# Patient Record
Sex: Male | Born: 1955 | Race: White | Hispanic: No | Marital: Married | Smoking: Former smoker
Health system: Southern US, Community
[De-identification: ages and names within clinical notes are randomized; demographics above are authoritative.]

## PROBLEM LIST (undated history)

## (undated) DIAGNOSIS — H52203 Unspecified astigmatism, bilateral: Secondary | ICD-10-CM

## (undated) DIAGNOSIS — E785 Hyperlipidemia, unspecified: Secondary | ICD-10-CM

## (undated) DIAGNOSIS — H269 Unspecified cataract: Secondary | ICD-10-CM

## (undated) DIAGNOSIS — T7840XA Allergy, unspecified, initial encounter: Secondary | ICD-10-CM

## (undated) HISTORY — DX: Allergy, unspecified, initial encounter: T78.40XA

## (undated) HISTORY — DX: Hyperlipidemia, unspecified: E78.5

## (undated) HISTORY — PX: WISDOM TOOTH EXTRACTION: SHX21

---
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
## 2000-11-13 ENCOUNTER — Encounter (INDEPENDENT_AMBULATORY_CARE_PROVIDER_SITE_OTHER): Payer: Self-pay | Admitting: Specialist

## 2000-11-13 ENCOUNTER — Ambulatory Visit (HOSPITAL_BASED_OUTPATIENT_CLINIC_OR_DEPARTMENT_OTHER): Admission: RE | Admit: 2000-11-13 | Discharge: 2000-11-13 | Payer: Self-pay | Admitting: General Surgery

## 2004-06-09 ENCOUNTER — Ambulatory Visit: Payer: Self-pay | Admitting: Internal Medicine

## 2004-06-16 ENCOUNTER — Ambulatory Visit: Payer: Self-pay | Admitting: Internal Medicine

## 2004-09-21 ENCOUNTER — Ambulatory Visit: Payer: Self-pay | Admitting: Internal Medicine

## 2004-09-29 ENCOUNTER — Ambulatory Visit: Payer: Self-pay | Admitting: Internal Medicine

## 2005-03-08 ENCOUNTER — Ambulatory Visit: Payer: Self-pay | Admitting: Internal Medicine

## 2005-03-22 ENCOUNTER — Ambulatory Visit: Payer: Self-pay | Admitting: Internal Medicine

## 2005-04-04 ENCOUNTER — Ambulatory Visit: Payer: Self-pay | Admitting: Internal Medicine

## 2005-06-28 ENCOUNTER — Ambulatory Visit: Payer: Self-pay | Admitting: Internal Medicine

## 2005-07-01 ENCOUNTER — Ambulatory Visit: Payer: Self-pay | Admitting: Family Medicine

## 2005-07-26 ENCOUNTER — Ambulatory Visit: Payer: Self-pay | Admitting: Internal Medicine

## 2005-08-17 ENCOUNTER — Ambulatory Visit: Payer: Self-pay | Admitting: Internal Medicine

## 2006-08-22 ENCOUNTER — Ambulatory Visit: Payer: Self-pay | Admitting: Internal Medicine

## 2006-08-22 LAB — CONVERTED CEMR LAB
ALT: 45 units/L — ABNORMAL HIGH (ref 0–40)
AST: 29 units/L (ref 0–37)
Albumin: 4.1 g/dL (ref 3.5–5.2)
Alkaline Phosphatase: 72 units/L (ref 39–117)
BUN: 10 mg/dL (ref 6–23)
Basophils Absolute: 0.1 10*3/uL (ref 0.0–0.1)
Basophils Relative: 1.1 % — ABNORMAL HIGH (ref 0.0–1.0)
Bilirubin, Direct: 0.1 mg/dL (ref 0.0–0.3)
CO2: 27 meq/L (ref 19–32)
Calcium: 9.3 mg/dL (ref 8.4–10.5)
Chloride: 107 meq/L (ref 96–112)
Cholesterol: 170 mg/dL (ref 0–200)
Creatinine, Ser: 0.9 mg/dL (ref 0.4–1.5)
Eosinophils Absolute: 0.2 10*3/uL (ref 0.0–0.6)
Eosinophils Relative: 2.6 % (ref 0.0–5.0)
GFR calc Af Amer: 115 mL/min
GFR calc non Af Amer: 95 mL/min
Glucose, Bld: 107 mg/dL — ABNORMAL HIGH (ref 70–99)
HCT: 44.1 % (ref 39.0–52.0)
HDL: 44.9 mg/dL (ref >39.0)
Hemoglobin: 15.3 g/dL (ref 13.0–17.0)
LDL Cholesterol: 90 mg/dL (ref 0–99)
Lymphocytes Relative: 26.7 % (ref 12.0–46.0)
MCHC: 34.8 g/dL (ref 30.0–36.0)
MCV: 85.3 fL (ref 78.0–100.0)
Monocytes Absolute: 0.6 10*3/uL (ref 0.2–0.7)
Monocytes Relative: 10.1 % (ref 3.0–11.0)
Neutro Abs: 3.8 10*3/uL (ref 1.4–7.7)
Neutrophils Relative %: 59.5 % (ref 43.0–77.0)
PSA: 0.45 ng/mL (ref 0.10–4.00)
Platelets: 209 10*3/uL (ref 150–400)
Potassium: 4.1 meq/L (ref 3.5–5.1)
RBC: 5.17 M/uL (ref 4.22–5.81)
RDW: 12.7 % (ref 11.5–14.6)
Sodium: 139 meq/L (ref 135–145)
TSH: 1.64 microintl units/mL (ref 0.35–5.50)
Total Bilirubin: 0.5 mg/dL (ref 0.3–1.2)
Total CHOL/HDL Ratio: 3.8
Total Protein: 7.4 g/dL (ref 6.0–8.3)
Triglycerides: 178 mg/dL — ABNORMAL HIGH (ref 0–149)
VLDL: 36 mg/dL (ref 0–40)
WBC: 6.4 10*3/uL (ref 4.5–10.5)

## 2006-08-29 ENCOUNTER — Ambulatory Visit: Payer: Self-pay | Admitting: Internal Medicine

## 2007-07-17 ENCOUNTER — Telehealth: Payer: Self-pay | Admitting: Internal Medicine

## 2007-08-29 ENCOUNTER — Ambulatory Visit: Payer: Self-pay | Admitting: Internal Medicine

## 2007-08-29 LAB — CONVERTED CEMR LAB
Bilirubin Urine: NEGATIVE
Blood in Urine, dipstick: NEGATIVE
Ketones, urine, test strip: NEGATIVE
Nitrite: NEGATIVE
Specific Gravity, Urine: >=1.03
Urobilinogen, UA: NEGATIVE
WBC Urine, dipstick: NEGATIVE
pH: 5.5

## 2007-08-30 LAB — CONVERTED CEMR LAB
ALT: 73 units/L — ABNORMAL HIGH (ref 0–53)
ALT: 73 units/L — ABNORMAL HIGH (ref 0–53)
AST: 39 units/L — ABNORMAL HIGH (ref 0–37)
AST: 39 units/L — ABNORMAL HIGH (ref 0–37)
Albumin: 4 g/dL (ref 3.5–5.2)
Albumin: 4 g/dL (ref 3.5–5.2)
Alkaline Phosphatase: 97 units/L (ref 39–117)
Alkaline Phosphatase: 97 units/L (ref 39–117)
BUN: 7 mg/dL (ref 6–23)
BUN: 7 mg/dL (ref 6–23)
Basophils Absolute: 0 10*3/uL (ref 0.0–0.1)
Basophils Absolute: 0 10*3/uL (ref 0.0–0.1)
Basophils Relative: 0.4 % (ref 0.0–1.0)
Basophils Relative: 0.4 % (ref 0.0–1.0)
Bilirubin, Direct: 0.2 mg/dL (ref 0.0–0.3)
Bilirubin, Direct: 0.2 mg/dL (ref 0.0–0.3)
CO2: 28 meq/L (ref 19–32)
CO2: 28 meq/L (ref 19–32)
Calcium: 9.4 mg/dL (ref 8.4–10.5)
Calcium: 9.4 mg/dL (ref 8.4–10.5)
Chloride: 101 meq/L (ref 96–112)
Chloride: 101 meq/L (ref 96–112)
Cholesterol: 181 mg/dL (ref 0–200)
Cholesterol: 181 mg/dL (ref 0–200)
Creatinine, Ser: 0.9 mg/dL (ref 0.4–1.5)
Creatinine, Ser: 0.9 mg/dL (ref 0.4–1.5)
Direct LDL: 100.3 mg/dL
Direct LDL: 100.3 mg/dL
Eosinophils Absolute: 0.1 10*3/uL (ref 0.0–0.6)
Eosinophils Absolute: 0.1 10*3/uL (ref 0.0–0.6)
Eosinophils Relative: 1.6 % (ref 0.0–5.0)
Eosinophils Relative: 1.6 % (ref 0.0–5.0)
GFR calc Af Amer: 115 mL/min
GFR calc Af Amer: 115 mL/min
GFR calc non Af Amer: 95 mL/min
GFR calc non Af Amer: 95 mL/min
Glucose, Bld: 198 mg/dL — ABNORMAL HIGH (ref 70–99)
Glucose, Bld: 198 mg/dL — ABNORMAL HIGH (ref 70–99)
HCT: 46 % (ref 39.0–52.0)
HCT: 46 % (ref 39.0–52.0)
HDL: 39.6 mg/dL (ref >39.0)
HDL: 39.6 mg/dL (ref >39.0)
Hemoglobin: 15.2 g/dL (ref 13.0–17.0)
Hemoglobin: 15.2 g/dL (ref 13.0–17.0)
Hgb A1c MFr Bld: 9.8 % — ABNORMAL HIGH (ref 4.6–6.0)
Lymphocytes Relative: 31.2 % (ref 12.0–46.0)
Lymphocytes Relative: 31.2 % (ref 12.0–46.0)
MCHC: 33 g/dL (ref 30.0–36.0)
MCHC: 33 g/dL (ref 30.0–36.0)
MCV: 87.5 fL (ref 78.0–100.0)
MCV: 87.5 fL (ref 78.0–100.0)
Monocytes Absolute: 0.5 10*3/uL (ref 0.2–0.7)
Monocytes Absolute: 0.5 10*3/uL (ref 0.2–0.7)
Monocytes Relative: 8.9 % (ref 3.0–11.0)
Monocytes Relative: 8.9 % (ref 3.0–11.0)
Neutro Abs: 3 10*3/uL (ref 1.4–7.7)
Neutro Abs: 3 10*3/uL (ref 1.4–7.7)
Neutrophils Relative %: 57.9 % (ref 43.0–77.0)
Neutrophils Relative %: 57.9 % (ref 43.0–77.0)
PSA: 0.46 ng/mL (ref 0.10–4.00)
PSA: 0.46 ng/mL (ref 0.10–4.00)
Platelets: 190 10*3/uL (ref 150–400)
Platelets: 190 10*3/uL (ref 150–400)
Potassium: 4.6 meq/L (ref 3.5–5.1)
Potassium: 4.6 meq/L (ref 3.5–5.1)
RBC: 5.26 M/uL (ref 4.22–5.81)
RBC: 5.26 M/uL (ref 4.22–5.81)
RDW: 12.3 % (ref 11.5–14.6)
RDW: 12.3 % (ref 11.5–14.6)
Sodium: 138 meq/L (ref 135–145)
Sodium: 138 meq/L (ref 135–145)
TSH: 1.16 microintl units/mL (ref 0.35–5.50)
TSH: 1.16 microintl units/mL (ref 0.35–5.50)
Total Bilirubin: 0.9 mg/dL (ref 0.3–1.2)
Total Bilirubin: 0.9 mg/dL (ref 0.3–1.2)
Total CHOL/HDL Ratio: 4.6
Total CHOL/HDL Ratio: 4.6
Total Protein: 6.9 g/dL (ref 6.0–8.3)
Total Protein: 6.9 g/dL (ref 6.0–8.3)
Triglycerides: 214 mg/dL (ref 0–149)
Triglycerides: 214 mg/dL (ref 0–149)
VLDL: 43 mg/dL — ABNORMAL HIGH (ref 0–40)
VLDL: 43 mg/dL — ABNORMAL HIGH (ref 0–40)
WBC: 5.3 10*3/uL (ref 4.5–10.5)
WBC: 5.3 10*3/uL (ref 4.5–10.5)

## 2007-09-04 ENCOUNTER — Ambulatory Visit: Payer: Self-pay | Admitting: Internal Medicine

## 2007-09-04 DIAGNOSIS — J309 Allergic rhinitis, unspecified: Secondary | ICD-10-CM

## 2007-09-04 DIAGNOSIS — E1169 Type 2 diabetes mellitus with other specified complication: Secondary | ICD-10-CM

## 2007-09-04 DIAGNOSIS — E785 Hyperlipidemia, unspecified: Secondary | ICD-10-CM

## 2007-09-04 DIAGNOSIS — E119 Type 2 diabetes mellitus without complications: Secondary | ICD-10-CM | POA: Insufficient documentation

## 2007-10-08 ENCOUNTER — Encounter: Admission: RE | Admit: 2007-10-08 | Discharge: 2007-10-08 | Payer: Self-pay | Admitting: Internal Medicine

## 2007-10-08 ENCOUNTER — Encounter: Payer: Self-pay | Admitting: Internal Medicine

## 2007-10-12 ENCOUNTER — Ambulatory Visit: Payer: Self-pay | Admitting: Internal Medicine

## 2007-10-26 ENCOUNTER — Ambulatory Visit: Payer: Self-pay | Admitting: Internal Medicine

## 2007-10-26 ENCOUNTER — Encounter: Payer: Self-pay | Admitting: Internal Medicine

## 2007-10-26 HISTORY — PX: COLONOSCOPY: SHX174

## 2007-12-26 ENCOUNTER — Ambulatory Visit: Payer: Self-pay | Admitting: Internal Medicine

## 2007-12-26 LAB — CONVERTED CEMR LAB
ALT: 63 units/L — ABNORMAL HIGH (ref 0–53)
AST: 34 units/L (ref 0–37)
Albumin: 3.9 g/dL (ref 3.5–5.2)
Alkaline Phosphatase: 79 units/L (ref 39–117)
BUN: 8 mg/dL (ref 6–23)
Bilirubin, Direct: 0.1 mg/dL (ref 0.0–0.3)
CO2: 29 meq/L (ref 19–32)
Calcium: 8.6 mg/dL (ref 8.4–10.5)
Chloride: 113 meq/L — ABNORMAL HIGH (ref 96–112)
Cholesterol: 168 mg/dL (ref 0–200)
Creatinine, Ser: 0.9 mg/dL (ref 0.4–1.5)
GFR calc Af Amer: 115 mL/min
GFR calc non Af Amer: 95 mL/min
Glucose, Bld: 136 mg/dL — ABNORMAL HIGH (ref 70–99)
HDL: 40.4 mg/dL (ref >39.0)
Hgb A1c MFr Bld: 7.8 % — ABNORMAL HIGH (ref 4.6–6.0)
LDL Cholesterol: 95 mg/dL (ref 0–99)
Potassium: 4.3 meq/L (ref 3.5–5.1)
Sodium: 135 meq/L (ref 135–145)
Total Bilirubin: 0.9 mg/dL (ref 0.3–1.2)
Total CHOL/HDL Ratio: 4.2
Total Protein: 7.4 g/dL (ref 6.0–8.3)
Triglycerides: 164 mg/dL — ABNORMAL HIGH (ref 0–149)
VLDL: 33 mg/dL (ref 0–40)

## 2008-01-02 ENCOUNTER — Ambulatory Visit: Payer: Self-pay | Admitting: Internal Medicine

## 2008-04-29 ENCOUNTER — Ambulatory Visit: Payer: Self-pay | Admitting: Internal Medicine

## 2008-04-29 LAB — CONVERTED CEMR LAB
ALT: 63 units/L — ABNORMAL HIGH (ref 0–53)
AST: 37 units/L (ref 0–37)
Alkaline Phosphatase: 65 units/L (ref 39–117)
Bilirubin, Direct: 0.1 mg/dL (ref 0.0–0.3)
CO2: 29 meq/L (ref 19–32)
Chloride: 106 meq/L (ref 96–112)
Glucose, Bld: 135 mg/dL — ABNORMAL HIGH (ref 70–99)
HDL: 41.1 mg/dL (ref >39.0)
Microalb, Ur: 0.5 mg/dL (ref 0.0–1.9)
Potassium: 4.2 meq/L (ref 3.5–5.1)
Sodium: 142 meq/L (ref 135–145)
Total Bilirubin: 0.8 mg/dL (ref 0.3–1.2)
Total CHOL/HDL Ratio: 4.2
Total Protein: 7.3 g/dL (ref 6.0–8.3)

## 2008-05-21 ENCOUNTER — Ambulatory Visit: Payer: Self-pay | Admitting: Internal Medicine

## 2008-08-04 ENCOUNTER — Ambulatory Visit: Payer: Self-pay | Admitting: Internal Medicine

## 2008-08-18 ENCOUNTER — Encounter: Payer: Self-pay | Admitting: Internal Medicine

## 2008-08-26 ENCOUNTER — Telehealth: Payer: Self-pay | Admitting: Internal Medicine

## 2008-08-27 ENCOUNTER — Encounter: Payer: Self-pay | Admitting: Internal Medicine

## 2008-09-17 ENCOUNTER — Ambulatory Visit: Payer: Self-pay | Admitting: Internal Medicine

## 2008-09-17 LAB — CONVERTED CEMR LAB
ALT: 63 units/L — ABNORMAL HIGH (ref 0–53)
AST: 37 units/L (ref 0–37)
Albumin: 3.9 g/dL (ref 3.5–5.2)
BUN: 8 mg/dL (ref 6–23)
Calcium: 9.2 mg/dL (ref 8.4–10.5)
Chloride: 104 meq/L (ref 96–112)
Cholesterol: 171 mg/dL (ref 0–200)
Creatinine, Ser: 0.9 mg/dL (ref 0.4–1.5)
GFR calc non Af Amer: 95 mL/min
HDL: 39.6 mg/dL (ref >39.0)
Hgb A1c MFr Bld: 8 % — ABNORMAL HIGH (ref 4.6–6.0)
LDL Cholesterol: 102 mg/dL — ABNORMAL HIGH (ref 0–99)
Total Bilirubin: 0.7 mg/dL (ref 0.3–1.2)
Total CHOL/HDL Ratio: 4.3
Triglycerides: 145 mg/dL (ref 0–149)

## 2008-09-29 ENCOUNTER — Ambulatory Visit: Payer: Self-pay | Admitting: Internal Medicine

## 2008-10-14 ENCOUNTER — Telehealth: Payer: Self-pay | Admitting: Internal Medicine

## 2009-01-29 ENCOUNTER — Ambulatory Visit: Payer: Self-pay | Admitting: Internal Medicine

## 2009-01-29 LAB — CONVERTED CEMR LAB
ALT: 47 units/L (ref 0–53)
AST: 29 units/L (ref 0–37)
BUN: 10 mg/dL (ref 6–23)
Bilirubin, Direct: 0.1 mg/dL (ref 0.0–0.3)
Calcium: 9.4 mg/dL (ref 8.4–10.5)
Cholesterol: 159 mg/dL (ref 0–200)
GFR calc non Af Amer: 94.26 mL/min (ref >60)
Glucose, Bld: 131 mg/dL — ABNORMAL HIGH (ref 70–99)
HDL: 40.8 mg/dL (ref >39.00)
Hgb A1c MFr Bld: 7 % — ABNORMAL HIGH (ref 4.6–6.5)
Total Bilirubin: 0.8 mg/dL (ref 0.3–1.2)
Total Protein: 7.4 g/dL (ref 6.0–8.3)

## 2009-02-16 ENCOUNTER — Ambulatory Visit: Payer: Self-pay | Admitting: Internal Medicine

## 2009-06-03 ENCOUNTER — Ambulatory Visit: Payer: Self-pay | Admitting: Internal Medicine

## 2009-06-03 LAB — CONVERTED CEMR LAB
Alkaline Phosphatase: 68 units/L (ref 39–117)
Basophils Absolute: 0.1 10*3/uL (ref 0.0–0.1)
Basophils Relative: 1.1 % (ref 0.0–3.0)
Bilirubin, Direct: 0.1 mg/dL (ref 0.0–0.3)
Blood in Urine, dipstick: NEGATIVE
CO2: 26 meq/L (ref 19–32)
Calcium: 9.3 mg/dL (ref 8.4–10.5)
Creatinine, Ser: 0.8 mg/dL (ref 0.4–1.5)
Creatinine,U: 129.5 mg/dL
Eosinophils Absolute: 0.1 10*3/uL (ref 0.0–0.7)
GFR calc non Af Amer: 107.83 mL/min (ref >60)
HDL: 42.3 mg/dL (ref >39.00)
Hgb A1c MFr Bld: 7.2 % — ABNORMAL HIGH (ref 4.6–6.5)
Ketones, urine, test strip: NEGATIVE
LDL Cholesterol: 85 mg/dL (ref 0–99)
Lymphocytes Relative: 27.7 % (ref 12.0–46.0)
MCHC: 33.5 g/dL (ref 30.0–36.0)
Microalb, Ur: 0.5 mg/dL (ref 0.0–1.9)
Neutrophils Relative %: 59.9 % (ref 43.0–77.0)
Nitrite: NEGATIVE
Protein, U semiquant: NEGATIVE
RBC: 4.88 M/uL (ref 4.22–5.81)
Total CHOL/HDL Ratio: 4
Total Protein: 7.3 g/dL (ref 6.0–8.3)
Triglycerides: 196 mg/dL — ABNORMAL HIGH (ref 0.0–149.0)
Urobilinogen, UA: 0.2
VLDL: 39.2 mg/dL (ref 0.0–40.0)

## 2009-06-17 ENCOUNTER — Ambulatory Visit: Payer: Self-pay | Admitting: Internal Medicine

## 2009-09-14 ENCOUNTER — Encounter: Payer: Self-pay | Admitting: Internal Medicine

## 2009-09-14 LAB — HM DIABETES EYE EXAM: HM Diabetic Eye Exam: NORMAL

## 2009-10-07 ENCOUNTER — Ambulatory Visit: Payer: Self-pay | Admitting: Internal Medicine

## 2009-10-07 LAB — CONVERTED CEMR LAB
ALT: 41 units/L (ref 0–53)
BUN: 11 mg/dL (ref 6–23)
CO2: 28 meq/L (ref 19–32)
Chloride: 106 meq/L (ref 96–112)
Cholesterol: 178 mg/dL (ref 0–200)
Creatinine, Ser: 1 mg/dL (ref 0.4–1.5)
Direct LDL: 109.3 mg/dL
Glucose, Bld: 128 mg/dL — ABNORMAL HIGH (ref 70–99)
Hgb A1c MFr Bld: 7.1 % — ABNORMAL HIGH (ref 4.6–6.5)
Total Protein: 7.7 g/dL (ref 6.0–8.3)

## 2009-10-15 ENCOUNTER — Ambulatory Visit: Payer: Self-pay | Admitting: Internal Medicine

## 2009-10-15 DIAGNOSIS — E049 Nontoxic goiter, unspecified: Secondary | ICD-10-CM | POA: Insufficient documentation

## 2009-10-20 ENCOUNTER — Encounter: Admission: RE | Admit: 2009-10-20 | Discharge: 2009-10-20 | Payer: Self-pay | Admitting: Internal Medicine

## 2009-12-28 ENCOUNTER — Telehealth: Payer: Self-pay | Admitting: Internal Medicine

## 2010-01-27 ENCOUNTER — Ambulatory Visit: Payer: Self-pay | Admitting: Internal Medicine

## 2010-01-27 LAB — CONVERTED CEMR LAB
ALT: 40 units/L (ref 0–53)
AST: 26 units/L (ref 0–37)
BUN: 13 mg/dL (ref 6–23)
Bilirubin, Direct: 0.1 mg/dL (ref 0.0–0.3)
CO2: 26 meq/L (ref 19–32)
Calcium: 9.4 mg/dL (ref 8.4–10.5)
GFR calc non Af Amer: 98.95 mL/min (ref >60)
Glucose, Bld: 132 mg/dL — ABNORMAL HIGH (ref 70–99)
Potassium: 4.3 meq/L (ref 3.5–5.1)
Total Bilirubin: 0.6 mg/dL (ref 0.3–1.2)

## 2010-02-04 ENCOUNTER — Ambulatory Visit: Payer: Self-pay | Admitting: Internal Medicine

## 2010-02-04 DIAGNOSIS — L909 Atrophic disorder of skin, unspecified: Secondary | ICD-10-CM | POA: Insufficient documentation

## 2010-02-04 DIAGNOSIS — L919 Hypertrophic disorder of the skin, unspecified: Secondary | ICD-10-CM

## 2010-02-08 ENCOUNTER — Telehealth: Payer: Self-pay | Admitting: Internal Medicine

## 2010-03-08 ENCOUNTER — Telehealth: Payer: Self-pay | Admitting: Internal Medicine

## 2010-04-13 ENCOUNTER — Telehealth: Payer: Self-pay | Admitting: Internal Medicine

## 2010-05-25 ENCOUNTER — Telehealth: Payer: Self-pay | Admitting: Internal Medicine

## 2010-07-30 ENCOUNTER — Other Ambulatory Visit: Payer: Self-pay | Admitting: Internal Medicine

## 2010-07-30 ENCOUNTER — Ambulatory Visit
Admission: RE | Admit: 2010-07-30 | Discharge: 2010-07-30 | Payer: Self-pay | Source: Home / Self Care | Attending: Internal Medicine | Admitting: Internal Medicine

## 2010-07-30 LAB — HEPATIC FUNCTION PANEL
ALT: 75 U/L — ABNORMAL HIGH (ref 0–53)
AST: 45 U/L — ABNORMAL HIGH (ref 0–37)
Albumin: 4.2 g/dL (ref 3.5–5.2)
Alkaline Phosphatase: 76 U/L (ref 39–117)
Bilirubin, Direct: 0.1 mg/dL (ref 0.0–0.3)
Total Bilirubin: 0.8 mg/dL (ref 0.3–1.2)
Total Protein: 7.4 g/dL (ref 6.0–8.3)

## 2010-07-30 LAB — CONVERTED CEMR LAB
Bilirubin Urine: NEGATIVE
Blood in Urine, dipstick: NEGATIVE
Ketones, urine, test strip: NEGATIVE
Protein, U semiquant: NEGATIVE
Urobilinogen, UA: 0.2

## 2010-07-30 LAB — BASIC METABOLIC PANEL
BUN: 8 mg/dL (ref 6–23)
CO2: 29 mEq/L (ref 19–32)
Calcium: 9.8 mg/dL (ref 8.4–10.5)
Chloride: 104 mEq/L (ref 96–112)
Creatinine, Ser: 0.8 mg/dL (ref 0.4–1.5)
GFR: 105.83 mL/min (ref >60.00)
Glucose, Bld: 173 mg/dL — ABNORMAL HIGH (ref 70–99)
Potassium: 4.1 mEq/L (ref 3.5–5.1)
Sodium: 141 mEq/L (ref 135–145)

## 2010-07-30 LAB — LIPID PANEL
Cholesterol: 181 mg/dL (ref 0–200)
HDL: 47.8 mg/dL (ref >39.00)
LDL Cholesterol: 95 mg/dL (ref 0–99)
Total CHOL/HDL Ratio: 4
Triglycerides: 193 mg/dL — ABNORMAL HIGH (ref 0.0–149.0)
VLDL: 38.6 mg/dL (ref 0.0–40.0)

## 2010-07-30 LAB — HEMOGLOBIN A1C: Hgb A1c MFr Bld: 8.6 % — ABNORMAL HIGH (ref 4.6–6.5)

## 2010-08-06 ENCOUNTER — Ambulatory Visit: Admit: 2010-08-06 | Payer: Self-pay | Admitting: Internal Medicine

## 2010-08-16 ENCOUNTER — Ambulatory Visit
Admission: RE | Admit: 2010-08-16 | Discharge: 2010-08-16 | Payer: Self-pay | Source: Home / Self Care | Attending: Internal Medicine | Admitting: Internal Medicine

## 2010-08-24 NOTE — Progress Notes (Signed)
Summary: Pt is sch for 6 mon ov. Req labs 1 wk prior  Phone Note Call from Patient Call back at Home Phone [phone]   Caller: Patient Summary of Call: Pt  sch for 6 month follow up in January 2012 and  is wanting to know if he is suppose to come in a week prior for blood work and urine test. Pls advise.    Initial call taken by: Lucy Antigua,  April 13, 2010 2:59 PM  Follow-up for Phone Call        labs one week prior to visit lipids---272.4 lfts-995.2 bmet-995.2 A1C-250.02    Follow-up by: Birdie Sons MD,  April 14, 2010 4:18 PM  Additional Follow-up for Phone Call Additional follow up Details #1::        Called and left msg for pt to c/b so that appt for labwork (lipids-272.4, lfts-995.2, bmet-995.2, A1C-250.02) can be scheduled - one week prior to his appt on 08/06/2010 with Dr Cato Mulligan.  Additional Follow-up by: Debbra Riding,  April 15, 2010 9:33 AM    Additional Follow-up for Phone Call Additional follow up Details #2::    Pt called back and appt was scheduled for 07/30/2010 for labwork.... However, pt wants to be certain that a UA is done because he always has his urine checked.... Simple order I know, but can you advise an order for same so that I can add it to the existing order for labwork per pts request?   Follow-up by: Debbra Riding,  April 15, 2010 11:21 AM

## 2010-08-24 NOTE — Progress Notes (Signed)
Summary: wants to know if he is diabetic?  Phone Note Call from Patient Call back at Home Phone [phone]   Caller: Patient----triage vm Summary of Call: Filling out info at his job for benefits. pt would like to know if he is actually diagnosed with Diabetes? He stated that he is on dm meds. He  stated that he did not know for sure. Please return his call @ home. Initial call taken by: Warnell Forester,  May 25, 2010 2:43 PM  Follow-up for Phone Call        see problem list  he has DM Follow-up by: Birdie Sons MD,  May 25, 2010 3:27 PM  Additional Follow-up for Phone Call Additional follow up Details #1::        pt aware Additional Follow-up by: Alfred Levins, CMA,  May 25, 2010 4:48 PM

## 2010-08-24 NOTE — Assessment & Plan Note (Signed)
Summary: 4 MONTH ROV/NJR---PT [facility] // RS   Vital Signs:  Patient profile:   55 year old male Weight:      231 pounds Temp:     98.3 degrees F oral BP sitting:   130 / 80  (left arm) Cuff size:   regular  Vitals Entered By: Kern Reap CMA Duncan Dull) (October 15, 2009 8:24 AM) CC: follow-up visit Is Patient Diabetic? Yes Did you bring your meter with you today? No   CC:  follow-up visit.  History of Present Illness:  Follow-Up Visit      This is a 55 year old man who presents for Follow-up visit.  The patient denies chest pain and palpitations.  Since the last visit the patient notes no new problems or concerns.  The patient reports taking meds as prescribed.  When questioned about possible medication side effects, the patient notes none.    All other systems reviewed and were negative   Diabetes Management History:      He is (or has been) enrolled in the "Diabetic Education Program".  He is not checking home blood sugars.  He says that he is not exercising regularly.    Current Problems (verified): 1)  Transaminases, Serum, Elevated-hepatiti Serologies Normal  (ICD-790.4) 2)  Diabetes-type 2  (ICD-250.00) 3)  Family History Diabetes 1st Degree Relative  (ICD-V18.0) 4)  Hyperlipidemia  (ICD-272.4) 5)  Allergic Rhinitis  (ICD-477.9) 6)  Physical Examination  (ICD-V70.0)  Current Medications (verified): 1)  Simvastatin 80 Mg  Tabs (Simvastatin) .... Once Daily 2)  [facility]vs Aspirin Ec 325 Mg  Tbec (Aspirin) .... One By Mouth Daily 3)  Nystatin-Triamcinolone 100000-0.1 Unit/gm-% Crea (Nystatin-Triamcinolone) .... Apply Two Times A Day As Needed 4)  Metformin Hcl 500 Mg Tabs (Metformin Hcl) .... 2 By Mouth Two Times A Day  Allergies (verified): No Known Drug Allergies  Past History:  Past Medical History: Last updated: 09/04/2007 Allergic rhinitis Hyperlipidemia  Past Surgical History: Last updated: 09/04/2007 Denies surgical history  Family History: Last updated:  09/04/2007 Family History Diabetes 1st degree relative  Social History: Last updated: 09/04/2007 Occupation: Married Never Smoked Alcohol use-yes Regular exercise-no  Risk Factors: Exercise: no (09/04/2007)  Risk Factors: Smoking Status: never (09/04/2007) Cigars/wk: occ (06/17/2009)  Physical Exam  General:  alert and well-developed.   Head:  normocephalic and atraumatic.   Eyes:  pupils equal and pupils round.   Ears:  R ear normal and L ear normal.   Neck:  No deformities, , or tenderness noted. Thyroid diffusely enlarged Chest Wall:  No deformities, masses, tenderness or gynecomastia noted. Lungs:  normal respiratory effort and no accessory muscle use.   Heart:  normal rate and regular rhythm.   Abdomen:  Bowel sounds positive,abdomen soft and non-tender without masses, organomegaly or hernias noted. overweight Msk:  straight leg test was negative; no lumbar pain appreciated;  patient able to flex and extend toes and ankles without difficulty Pulses:  R radial normal and L radial normal.   Neurologic:  cranial nerves II-XII intact and gait normal.   Skin:  turgor normal and color normal.   Psych:  normally interactive and good eye contact.     Impression & Recommendations:  Problem # 1:  DIABETES-TYPE 2 (ICD-250.00) reasonable control continue current medications  His updated medication list for this problem includes:    [facility]vs Aspirin Ec 325 Mg Tbec (Aspirin) ..... One by mouth daily    Metformin Hcl 500 Mg Tabs (Metformin hcl) .Marland Kitchen... 2 by mouth two  times a day  Labs Reviewed: Creat: 1.0 (10/07/2009)     Last Eye Exam: normal (09/14/2009) Reviewed HgBA1c results: 7.1 (10/07/2009)  7.2 (06/03/2009)  Problem # 2:  HYPERLIPIDEMIA (ICD-272.4) controlled continue current medications  His updated medication list for this problem includes:    Simvastatin 80 Mg Tabs (Simvastatin) ..... Once daily  Labs Reviewed: SGOT: 26 (10/07/2009)   SGPT: 41 (10/07/2009)    HDL:51.50 (10/07/2009), 42.30 (06/03/2009)  LDL:85 (06/03/2009), 91 (01/29/2009)  Chol:178 (10/07/2009), 166 (06/03/2009)  Trig:245.0 (10/07/2009), 196.0 (06/03/2009)  Problem # 3:  TRANSAMINASES, SERUM, ELEVATED-HEPATITI SEROLOGIES NORMAL (ICD-790.4) reviewed labs. resolved pt would like AK on back removed would like skin tags removed will set up appt  Complete Medication List: 1)  Simvastatin 80 Mg Tabs (Simvastatin) .... Once daily 2)  Cvs Aspirin Ec 325 Mg Tbec (Aspirin) .... One by mouth daily 3)  Nystatin-triamcinolone 100000-0.1 Unit/gm-% Crea (Nystatin-triamcinolone) .... Apply two times a day as needed 4)  Metformin Hcl 500 Mg Tabs (Metformin hcl) .... 2 by mouth two times a day  Other Orders: Radiology Referral (Radiology)  Patient Instructions: 1)  Please schedule a follow-up appointment in 4 months. 2)  labs one week prior to visit 3)  lipids---272.4 4)  lfts-995.2 5)  bmet-995.2 6)  A1C-250.02 7)

## 2010-08-24 NOTE — Progress Notes (Signed)
Summary: Pt req refill of Simvastatin  to [facility] mail order  Phone Note Call from Patient Call back at Home Phone [phone]   Caller: Patient Summary of Call: Pt req refill of Simvastatin 80mg , pls send in to [facility]  Initial call taken by: Lucy Antigua,  December 28, 2009 8:45 AM    Prescriptions: SIMVASTATIN 80 MG  TABS (SIMVASTATIN) once daily  #90 x 3   Entered by:   Raechel Ache, RN   Authorized by:   Birdie Sons MD   Signed by:   Raechel Ache, RN on 12/28/2009   Method used:   Electronically to        [facility]. [phone]* (retail)       [address]       [place], [place]  [address]       Ph: [phone]       Fax: [phone]   RxID:   [id number]

## 2010-08-24 NOTE — Progress Notes (Signed)
Summary: Pt wants to know if he needs lab work done, before next [employer] Note Call from Patient Call back at [employer] [phone]   Caller: Patient Summary of Call: Pt called and wanted to know if he needs lab work done, before his next ov in January? Initial call taken by: Lucy Antigua,  February 08, 2010 9:24 AM  Follow-up for Phone Call        no, i'll draw at OV if necessary Follow-up by: Birdie Sons MD,  February 08, 2010 1:13 PM  Additional Follow-up for Phone Call Additional follow up Details #1::        Patient notified.  Additional Follow-up by: Gladis Riffle, RN,  February 08, 2010 2:20 PM

## 2010-08-24 NOTE — Assessment & Plan Note (Signed)
Summary: skin tag/liquid ntg/njr/ccm   Vital Signs:  Patient profile:   55 year old male Height:      71 inches (180.34 cm) Weight:      225.31 pounds (102.41 kg) Temp:     98.2 degrees F (36.78 degrees C) oral Pulse rate:   80 / minute BP sitting:   118 / 72  (left arm) Cuff size:   regular  Vitals Entered By: Josph Macho RMA (February 04, 2010 10:06 AM)  Procedure Note Last Tetanus: Tdap (08/25/2006)  Skin Tag Removal: Onset of lesion: years  Procedure # 1: skin tag(s) removed    Location: left axilla    # lesions removed: 4    Instrument used: scissors    Comment: verbal consent  CC: Skin tag/  CF Is Patient Diabetic? Yes   CC:  Skin tag/  CF.  Current Medications (verified): 1)  Simvastatin 80 Mg  Tabs (Simvastatin) .... Once Daily 2)  Cvs Aspirin Ec 325 Mg  Tbec (Aspirin) .... One By Mouth Daily 3)  Nystatin-Triamcinolone 100000-0.1 Unit/gm-% Crea (Nystatin-Triamcinolone) .... Apply Two Times A Day As Needed 4)  Metformin Hcl 500 Mg Tabs (Metformin Hcl) .... 2 By Mouth Two Times A Day  Allergies (verified): No Known Drug Allergies   Complete Medication List: 1)  Simvastatin 80 Mg Tabs (Simvastatin) .... Once daily 2)  [facility]vs Aspirin Ec 325 Mg Tbec (Aspirin) .... One by mouth daily 3)  Nystatin-triamcinolone 100000-0.1 Unit/gm-% Crea (Nystatin-triamcinolone) .... Apply two times a day as needed 4)  Metformin Hcl 500 Mg Tabs (Metformin hcl) .... 2 by mouth two times a day  Other Orders: Removal of Skin Tags up to 15 Lesions (11200)

## 2010-08-24 NOTE — Letter (Signed)
Summary: [facility]  Eye Exam-[facility]   Imported By: Maryln Gottron 09/21/2009 15:15:41  _____________________________________________________________________  External Attachment:    Type:   Image     Comment:   External Document

## 2010-08-24 NOTE — Miscellaneous (Signed)
Summary: diabetic eye exam   Clinical Lists Changes  Observations: Added new observation of EYES COMMENT: 09/2010 (09/14/2009 16:07) Added new observation of EYE EXAM BY: [facility] (09/14/2009 16:07) Added new observation of DMEYEEXMRES: normal (09/14/2009 16:07) Added new observation of DIAB EYE EX: normal (09/14/2009 16:07)      Diabetes Management History:      He is (or has been) enrolled in the "Diabetic Education Program".  He is not checking home blood sugars.  He says that he is not exercising regularly.    Diabetes Management Exam:    Eye Exam:       Eye Exam done elsewhere          Date: 09/14/2009          Results: normal          Done by: [facility]

## 2010-08-24 NOTE — Progress Notes (Signed)
Summary: refill simvastatin,metformin  Phone Note Refill Request Message from:  Fax from Pharmacy on March 08, 2010 2:15 PM  Refills Requested: Medication #1:  METFORMIN HCL 500 MG TABS 2 by mouth two times a day.  Medication #2:  SIMVASTATIN 80 MG  TABS once daily [facility]   Method Requested: Fax to Local Pharmacy Initial call taken by: Duard Brady LPN,  March 08, 2010 2:15 PM    Prescriptions: METFORMIN HCL 500 MG TABS (METFORMIN HCL) 2 by mouth two times a day  #360 x 3   Entered by:   Duard Brady LPN   Authorized by:   Birdie Sons MD   Signed by:   Duard Brady LPN on 09/81/1914   Method used:   Faxed to ...       [facility] [address] (mail-order)       [address] [place], [place]  [address]       Ph: [phone]       Fax: [phone]   RxID:   [id number] SIMVASTATIN 80 MG  TABS (SIMVASTATIN) once daily  #90 x 3   Entered by:   Duard Brady LPN   Authorized by:   Birdie Sons MD   Signed by:   Duard Brady LPN on 40/34/7425   Method used:   Faxed to ...       [facility] [address] (mail-order)       [address] [place], [place]  [address]       Ph: [phone]       Fax: [phone]   RxID:   [id number]

## 2010-08-26 NOTE — Assessment & Plan Note (Signed)
Summary: 6 month rov/njr/pt rsc/cjr/PT RSC/CJR   Vital Signs:  Patient profile:   55 year old male Weight:      236 pounds BMI:     33.03 Temp:     98.2 degrees F oral Pulse rate:   92 / minute Pulse rhythm:   regular BP sitting:   142 / 88  (left arm) Cuff size:   regular  Vitals Entered By: Alfred Levins, CMA (August 16, 2010 10:53 AM) CC: discuss labs   CC:  discuss labs.  History of Present Illness:  Follow-Up Visit      This is a 55 year old man who presents for Follow-up visit.  The patient denies chest pain and palpitations.  Since the last visit the patient notes no new problems or concerns.  The patient reports taking meds as prescribed, not monitoring BP, not monitoring blood sugars, and dietary noncompliance.  When questioned about possible medication side effects, the patient notes none.  Note weight gain  Current Medications (verified): 1)  Simvastatin 80 Mg  Tabs (Simvastatin) .... Once Daily 2)  Cvs Aspirin Ec 325 Mg  Tbec (Aspirin) .... One By Mouth Daily 3)  Nystatin-Triamcinolone 100000-0.1 Unit/gm-% Crea (Nystatin-Triamcinolone) .... Apply Two Times A Day As Needed 4)  Metformin Hcl 500 Mg Tabs (Metformin Hcl) .... 2 By Mouth Two Times A Day  Allergies (verified): No Known Drug Allergies  Physical Exam  General:  alert and well-developed.   Head:  normocephalic and atraumatic.   Eyes:  pupils equal and pupils round.   Ears:  R ear normal and L ear normal.   Neck:  No deformities, , or tenderness noted. Thyroid diffusely enlarged Lungs:  normal respiratory effort and no accessory muscle use.   Heart:  normal rate and regular rhythm.   Abdomen:  Bowel sounds positive,abdomen soft and non-tender without masses, organomegaly or hernias noted. overweight Skin:  turgor normal and color normal.   Cervical Nodes:  no anterior cervical adenopathy and no posterior cervical adenopathy.   Psych:  good eye contact and not anxious appearing.     Impression &  Recommendations:  Problem # 1:  DIABETES-TYPE 2 (ICD-250.00) not controlled His updated medication list for this problem includes:    Cvs Aspirin Ec 325 Mg Tbec (Aspirin) ..... One by mouth daily    Metformin Hcl 500 Mg Tabs (Metformin hcl) .Marland Kitchen... 2 by mouth two times a day    Glimepiride 2 Mg Tabs (Glimepiride) .Marland Kitchen... 1 by mouth daily with breakfast  Labs Reviewed: Creat: 0.8 (07/30/2010)     Last Eye Exam: normal (09/14/2009) Reviewed HgBA1c results: 8.6 (07/30/2010)  7.3 (01/27/2010)  Problem # 2:  THYROID MASS (ICD-240.9) reviewed [place]  Problem # 3:  HYPERLIPIDEMIA (ICD-272.4) controlled note increased lfts---suspect more related to weight---note historical elevation in lfts.  His updated medication list for this problem includes:    Simvastatin 80 Mg Tabs (Simvastatin) ..... Once daily  Labs Reviewed: SGOT: 45 (07/30/2010)   SGPT: 75 (07/30/2010)   HDL:47.80 (07/30/2010), 46.10 (01/27/2010)  LDL:95 (07/30/2010), 106 (13/24/4010)  Chol:181 (07/30/2010), 174 (01/27/2010)  Trig:193.0 (07/30/2010), 109.0 (01/27/2010)  Problem # 4:  TRANSAMINASES, SERUM, ELEVATED-HEPATITI SEROLOGIES NORMAL (ICD-790.4) reviewed will follow cpx next visit  Complete Medication List: 1)  Simvastatin 80 Mg Tabs (Simvastatin) .... Once daily 2)  Cvs Aspirin Ec 325 Mg Tbec (Aspirin) .... One by mouth daily 3)  Nystatin-triamcinolone 100000-0.1 Unit/gm-% Crea (Nystatin-triamcinolone) .... Apply two times a day as needed 4)  Metformin Hcl 500 Mg  Tabs (Metformin hcl) .... 2 by mouth two times a day 5)  Glimepiride 2 Mg Tabs (Glimepiride) .Marland Kitchen.. 1 by mouth daily with breakfast  Patient Instructions: 1)  4 months---CPX with A1C 2)  It is important that you exercise regularly at least 20 minutes 5 times a week. If you develop chest pain, have severe difficulty breathing, or feel very tired , stop exercising immediately and seek medical attention. 3)  You need to lose weight. Consider a lower calorie diet and  regular exercise.  Prescriptions: GLIMEPIRIDE 2 MG TABS (GLIMEPIRIDE) 1 by mouth daily with breakfast  #90 x 3   Entered and Authorized by:   Birdie Sons MD   Signed by:   Birdie Sons MD on 08/16/2010   Method used:   Faxed to ...       [facility] [address] (mail-order)       [address] [place], [place]  [address]       Ph: [phone]       Fax: [phone]   RxID:   [id number] GLIMEPIRIDE 2 MG TABS (GLIMEPIRIDE) 1 by mouth daily with breakfast  #90 x 3   Entered and Authorized by:   Birdie Sons MD   Signed by:   Birdie Sons MD on 08/16/2010   Method used:   Electronically to        [facility]. [phone]* (retail)       [address]       [place], [place]  [address]       Ph: [phone]       Fax: [phone]   RxID:   [id number]    Orders Added: 1)  Est. Patient Level IV [25852]

## 2010-11-11 ENCOUNTER — Other Ambulatory Visit (INDEPENDENT_AMBULATORY_CARE_PROVIDER_SITE_OTHER): Payer: 59 | Admitting: Internal Medicine

## 2010-11-11 DIAGNOSIS — Z0000 Encounter for general adult medical examination without abnormal findings: Secondary | ICD-10-CM

## 2010-11-11 LAB — HEPATIC FUNCTION PANEL
AST: 29 U/L (ref 0–37)
Albumin: 4.1 g/dL (ref 3.5–5.2)
Total Protein: 7.1 g/dL (ref 6.0–8.3)

## 2010-11-11 LAB — CBC WITH DIFFERENTIAL/PLATELET
Basophils Absolute: 0.1 10*3/uL (ref 0.0–0.1)
Basophils Relative: 0.9 % (ref 0.0–3.0)
Eosinophils Absolute: 0.1 10*3/uL (ref 0.0–0.7)
HCT: 43.9 % (ref 39.0–52.0)
Hemoglobin: 15 g/dL (ref 13.0–17.0)
Lymphs Abs: 1.7 10*3/uL (ref 0.7–4.0)
MCHC: 34.2 g/dL (ref 30.0–36.0)
Monocytes Relative: 9.7 % (ref 3.0–12.0)
Neutro Abs: 3.6 10*3/uL (ref 1.4–7.7)
RBC: 4.96 Mil/uL (ref 4.22–5.81)
RDW: 13 % (ref 11.5–14.6)

## 2010-11-11 LAB — POCT URINALYSIS DIPSTICK
Bilirubin, UA: NEGATIVE
Glucose, UA: NEGATIVE
Ketones, UA: NEGATIVE
Leukocytes, UA: NEGATIVE
Nitrite, UA: NEGATIVE
pH, UA: 5

## 2010-11-11 LAB — LIPID PANEL
HDL: 47.2 mg/dL (ref >39.00)
Total CHOL/HDL Ratio: 4

## 2010-11-11 LAB — BASIC METABOLIC PANEL
CO2: 27 mEq/L (ref 19–32)
Glucose, Bld: 140 mg/dL — ABNORMAL HIGH (ref 70–99)
Potassium: 4.7 mEq/L (ref 3.5–5.1)
Sodium: 140 mEq/L (ref 135–145)

## 2010-11-11 LAB — MICROALBUMIN / CREATININE URINE RATIO: Microalb, Ur: 0.9 mg/dL (ref 0.0–1.9)

## 2010-11-11 LAB — HEMOGLOBIN A1C: Hgb A1c MFr Bld: 7.5 % — ABNORMAL HIGH (ref 4.6–6.5)

## 2010-11-11 LAB — TSH: TSH: 2.12 u[IU]/mL (ref 0.35–5.50)

## 2010-11-23 ENCOUNTER — Ambulatory Visit (INDEPENDENT_AMBULATORY_CARE_PROVIDER_SITE_OTHER): Payer: 59 | Admitting: Internal Medicine

## 2010-11-23 ENCOUNTER — Encounter: Payer: Self-pay | Admitting: Internal Medicine

## 2010-11-23 ENCOUNTER — Telehealth: Payer: Self-pay | Admitting: Internal Medicine

## 2010-11-23 DIAGNOSIS — E049 Nontoxic goiter, unspecified: Secondary | ICD-10-CM

## 2010-11-23 DIAGNOSIS — E119 Type 2 diabetes mellitus without complications: Secondary | ICD-10-CM

## 2010-11-23 DIAGNOSIS — E785 Hyperlipidemia, unspecified: Secondary | ICD-10-CM

## 2010-11-23 NOTE — Telephone Encounter (Signed)
Pt is wondering if he should get a UA added to future lab for 4 month fup. Pt says that ua is usually done during his labs, but it was not included in order this time. Pls advise if ok to add.

## 2010-11-23 NOTE — Assessment & Plan Note (Signed)
adequately controlled Continue same meds

## 2010-11-23 NOTE — Assessment & Plan Note (Addendum)
Lab Results  Component Value Date   HGBA1C 7.5* 11/11/2010   CREATININE 0.9 11/11/2010   MICROALBUR 0.9 11/11/2010   MICRALBCREAT 0.6 11/11/2010   CHOL 166 11/11/2010   HDL 47.20 11/11/2010   TRIG 143.0 11/11/2010    Last eye exam and foot exam:    Component Value Date/Time   HMDIABEYEEXA normal 09/14/2009   HMDIABFOOTEX yes 06/17/2009    Assessment: Diabetes control: improving but not yet controlled Progress toward goals: improved Barriers to meeting goals: no barriers identified  Plan: Diabetes treatment: continue current medications Refer to: none Instruction/counseling given: discussed the need for weight loss

## 2010-11-23 NOTE — Telephone Encounter (Signed)
LMTCB

## 2010-11-23 NOTE — Assessment & Plan Note (Signed)
No change in exam

## 2010-11-23 NOTE — Progress Notes (Signed)
  Subjective:    Patient ID: Juan Bennett, male    DOB: [birth year], 55 y.o.   MRN: [id number]  HPI  cpx   patient comes in for followup of multiple medical problems including type 2 diabetes, hyperlipidemia, hypertension. The patient does not check blood sugar or blood pressure at home. The patetient does not follow an exercise or diet program. The patient denies any polyuria, polydipsia.  In the past the patient has gone to diabetic treatment center. The patient is tolerating medications  Without difficulty. The patient does admit to medication compliance.   Past Medical History  Diagnosis Date  . Allergy   . Hyperlipidemia   . Diabetes mellitus    History reviewed. No pertinent past surgical history.  reports that he has never smoked. He does not have any smokeless tobacco history on file. He reports that he drinks about 3.6 ounces of alcohol per week. He reports that he does not use illicit drugs. family history includes Deep vein thrombosis in his father and Diabetes in his mother and paternal uncle. No Known Allergies   Review of Systems  patient denies chest pain, shortness of breath, orthopnea. Denies lower extremity edema, abdominal pain, change in appetite, change in bowel movements. Patient denies rashes, musculoskeletal complaints. No other specific complaints in a complete review of systems.      Objective:   Physical Exam .Well-developed male in no acute distress. HEENT exam atraumatic, normocephalic, extraocular muscles are intact. Conjunctivae are pink without exudate. Neck is supple without lymphadenopathy, thyromegaly, jugular venous distention. Chest is clear to auscultation without increased work of breathing. Cardiac exam S1-S2 are regular. The PMI is normal. No significant murmurs or gallops. Abdominal exam active bowel sounds, soft, nontender. No abdominal bruits. Extremities no clubbing cyanosis or edema. Peripheral pulses are normal without bruits. Neurologic  exam alert and oriented without any motor or sensory deficits. Rectal exam normal tone prostate normal size without masses or asymmetry.        Assessment & Plan:  Well visit Health maint utd Advised regular exercise, low calorie diet and aggressive weight loss

## 2010-11-24 NOTE — Telephone Encounter (Signed)
Per Dr Cato Mulligan, no need to do a urine.  Called and l/m on pts home machine per pts request

## 2010-11-30 ENCOUNTER — Other Ambulatory Visit: Payer: Self-pay | Admitting: Physical Medicine and Rehabilitation

## 2010-11-30 ENCOUNTER — Ambulatory Visit
Admission: RE | Admit: 2010-11-30 | Discharge: 2010-11-30 | Disposition: A | Discharge status: Home / Self Care | Payer: No Typology Code available for payment source | Source: Ambulatory Visit | Attending: Physical Medicine and Rehabilitation | Admitting: Physical Medicine and Rehabilitation

## 2010-11-30 DIAGNOSIS — Z0289 Encounter for other administrative examinations: Secondary | ICD-10-CM

## 2011-02-16 ENCOUNTER — Encounter: Payer: Self-pay | Admitting: Family Medicine

## 2011-02-16 ENCOUNTER — Ambulatory Visit (INDEPENDENT_AMBULATORY_CARE_PROVIDER_SITE_OTHER): Payer: No Typology Code available for payment source | Admitting: Family Medicine

## 2011-02-16 DIAGNOSIS — M79609 Pain in unspecified limb: Secondary | ICD-10-CM

## 2011-02-16 DIAGNOSIS — H6120 Impacted cerumen, unspecified ear: Secondary | ICD-10-CM

## 2011-02-16 DIAGNOSIS — M79673 Pain in unspecified foot: Secondary | ICD-10-CM

## 2011-02-16 NOTE — Progress Notes (Signed)
  Subjective:    Patient ID: Juan Bennett, male    DOB: [birth year], 55 y.o.   MRN: [id number]  HPI Impacted cerumen left ear greater than right. Some impairment in hearing. Noted by work Engineer, civil (consulting). Also has frequent nasal congestion symptoms. Some postnasal drip symptoms. No fever. Does not take any medications for nasal congestive symptoms. Wears earplugs at work  Also some recent fleeting left foot pain. No swelling. No color changes. No injury. Pain is dorsal and lateral lasting about 30 minutes usually worse late in the day after doing lots of walking. No persistent pain. No localized tenderness. No claudication symptoms.   Review of Systems  Constitutional: Negative for fever and chills.  HENT: Positive for congestion. Negative for ear pain, sinus pressure and ear discharge.        Objective:   Physical Exam  Constitutional: He appears well-developed and well-nourished.  HENT:  Head: Normocephalic and atraumatic.  Nose: Nose normal.  Mouth/Throat: Oropharynx is clear and moist. No oropharyngeal exudate.       Bilateral cerumen impaction. Irrigated without difficulty. Eardrums appear normal  Eyes: Pupils are equal, round, and reactive to light.  Neck: Neck supple.  Cardiovascular: Normal rate and regular rhythm.   Pulmonary/Chest: Effort normal and breath sounds normal. No respiratory distress. He has no wheezes. He has no rales.  Musculoskeletal:       Left foot refills no edema. No ecchymosis. No warmth. Good dorsalis pedis pulse. No localized tenderness.  Lymphadenopathy:    He has no cervical adenopathy.          Assessment & Plan:  #1 cerumen impaction. Irrigation with good success #2 postnasal drip symptoms. Try Allegra or Zyrtec #3 fleeting left foot pain. Uncertain etiology with normal exam. Reassurance given

## 2011-02-16 NOTE — Patient Instructions (Signed)
Try Allegra or Zyrtec for nasal congestion symptoms.

## 2011-03-11 ENCOUNTER — Other Ambulatory Visit (INDEPENDENT_AMBULATORY_CARE_PROVIDER_SITE_OTHER): Payer: 59

## 2011-03-11 DIAGNOSIS — E119 Type 2 diabetes mellitus without complications: Secondary | ICD-10-CM

## 2011-03-11 LAB — LIPID PANEL
HDL: 54 mg/dL (ref >39.00)
Total CHOL/HDL Ratio: 3
Triglycerides: 171 mg/dL — ABNORMAL HIGH (ref 0.0–149.0)

## 2011-03-11 LAB — BASIC METABOLIC PANEL
GFR: 97.22 mL/min (ref >60.00)
Glucose, Bld: 121 mg/dL — ABNORMAL HIGH (ref 70–99)
Potassium: 4.3 mEq/L (ref 3.5–5.1)
Sodium: 141 mEq/L (ref 135–145)

## 2011-03-11 LAB — HEPATIC FUNCTION PANEL
ALT: 34 U/L (ref 0–53)
Total Protein: 7.5 g/dL (ref 6.0–8.3)

## 2011-03-15 ENCOUNTER — Other Ambulatory Visit: Payer: Self-pay

## 2011-03-17 ENCOUNTER — Other Ambulatory Visit: Payer: 59

## 2011-03-24 ENCOUNTER — Ambulatory Visit: Payer: No Typology Code available for payment source | Admitting: Internal Medicine

## 2011-03-25 ENCOUNTER — Ambulatory Visit: Payer: 59 | Admitting: Internal Medicine

## 2011-04-13 ENCOUNTER — Encounter: Payer: Self-pay | Admitting: Internal Medicine

## 2011-04-13 ENCOUNTER — Ambulatory Visit: Payer: Self-pay | Admitting: Internal Medicine

## 2011-04-13 ENCOUNTER — Ambulatory Visit (INDEPENDENT_AMBULATORY_CARE_PROVIDER_SITE_OTHER): Payer: 59 | Admitting: Internal Medicine

## 2011-04-13 VITALS — BP 124/74 | HR 80 | Temp 98.0°F | Ht 71.0 in | Wt 220.0 lb

## 2011-04-13 DIAGNOSIS — E119 Type 2 diabetes mellitus without complications: Secondary | ICD-10-CM

## 2011-04-13 DIAGNOSIS — E785 Hyperlipidemia, unspecified: Secondary | ICD-10-CM

## 2011-04-13 MED ORDER — SIMVASTATIN 80 MG PO TABS: 40.0000 mg | ORAL_TABLET | Freq: Every day | ORAL | Status: DC

## 2011-04-13 NOTE — Assessment & Plan Note (Addendum)
Controlled Decrease simvastatin to 40 mg Lab Results  Component Value Date   CHOL 147 03/11/2011   CHOL 166 11/11/2010   CHOL 181 07/30/2010   Lab Results  Component Value Date   HDL 54.00 03/11/2011   HDL 09.81 11/11/2010   HDL 19.14 07/30/2010   Lab Results  Component Value Date   LDLCALC 59 03/11/2011   LDLCALC 90 11/11/2010   LDLCALC 95 07/30/2010   Lab Results  Component Value Date   TRIG 171.0* 03/11/2011   TRIG 143.0 11/11/2010   TRIG 193.0* 07/30/2010   Lab Results  Component Value Date   CHOLHDL 3 03/11/2011   CHOLHDL 4 11/11/2010   CHOLHDL 4 07/30/2010   Lab Results  Component Value Date   LDLDIRECT 109.3 10/07/2009   LDLDIRECT 100.3 08/30/2007   LDLDIRECT 100.3 08/29/2007

## 2011-04-13 NOTE — Assessment & Plan Note (Signed)
Lab Results  Component Value Date   HGBA1C 6.4 03/11/2011   CREATININE 0.9 03/11/2011   MICROALBUR 0.9 11/11/2010   MICRALBCREAT 0.6 11/11/2010   CHOL 147 03/11/2011   HDL 54.00 03/11/2011   TRIG 171.0* 03/11/2011    Last eye exam and foot exam:    Component Value Date/Time   HMDIABEYEEXA normal 09/14/2009   HMDIABFOOTEX yes 06/17/2009    Assessment: Diabetes control: controlled Progress toward goals: at goal Barriers to meeting goals: no barriers identified  Plan: Diabetes treatment: continue current medications Refer to: none Instruction/counseling given: reminded to get eye exam, discussed foot care, discussed the need for weight loss and discussed diet

## 2011-04-13 NOTE — Progress Notes (Signed)
  Subjective:    Patient ID: Juan Bennett, male    DOB: Jan 03, 1956, 55 y.o.   MRN: [id number]  HPI  patient comes in for followup of multiple medical problems including type 2 diabetes, hyperlipidemia, hypertension. The patient does not check blood sugar or blood pressure at home. The patetient does not follow an exercise or diet program. The patient denies any polyuria, polydipsia.  In the past the patient has gone to diabetic treatment center. The patient is tolerating medications  Without difficulty. The patient does admit to medication compliance.   Past Medical History  Diagnosis Date  . Allergy   . Hyperlipidemia   . Diabetes mellitus    No past surgical history on file.  reports that he has quit smoking. His smoking use included Cigarettes and Cigars. He does not have any smokeless tobacco history on file. He reports that he drinks about 3.6 ounces of alcohol per week. He reports that he does not use illicit drugs. family history includes Deep vein thrombosis in his father and Diabetes in his mother and paternal uncle. No Known Allergies    Review of Systems  patient denies chest pain, shortness of breath, orthopnea. Denies lower extremity edema, abdominal pain, change in appetite, change in bowel movements. Patient denies rashes, musculoskeletal complaints. No other specific complaints in a complete review of systems.      Objective:   Physical Exam  well-developed well-nourished male in no acute distress. HEENT exam atraumatic, normocephalic, neck supple without jugular venous distention. Chest clear to auscultation cardiac exam S1-S2 are regular. Abdominal exam overweight with bowel sounds, soft and nontender. Extremities no edema. Neurologic exam is alert with a normal gait.        Assessment & Plan:

## 2011-04-18 ENCOUNTER — Other Ambulatory Visit: Payer: Self-pay | Admitting: Internal Medicine

## 2011-07-15 ENCOUNTER — Other Ambulatory Visit: Payer: Self-pay | Admitting: Internal Medicine

## 2011-08-26 LAB — HM DIABETES EYE EXAM: HM Diabetic Eye Exam: NORMAL

## 2011-09-08 ENCOUNTER — Other Ambulatory Visit: Payer: Self-pay | Admitting: Internal Medicine

## 2011-09-19 ENCOUNTER — Telehealth: Payer: Self-pay | Admitting: Internal Medicine

## 2011-09-19 MED ORDER — METFORMIN HCL 500 MG PO TABS: 1000.0000 mg | ORAL_TABLET | Freq: Two times a day (BID) | ORAL | Status: DC

## 2011-09-19 MED ORDER — SIMVASTATIN 80 MG PO TABS: 80.0000 mg | ORAL_TABLET | Freq: Every day | ORAL | Status: DC

## 2011-09-19 NOTE — Telephone Encounter (Signed)
rx sent in electronically 

## 2011-09-19 NOTE — Telephone Encounter (Signed)
Pt called and said that he check with [facility] and was told that they had not rcvd script for metFORMIN (GLUCOPHAGE) 500 MG tablet and simvastatin (ZOCOR) 80 MG tablet. Pls call in to [facility] asap today.

## 2011-09-28 ENCOUNTER — Other Ambulatory Visit (INDEPENDENT_AMBULATORY_CARE_PROVIDER_SITE_OTHER): Payer: 59

## 2011-09-28 DIAGNOSIS — E119 Type 2 diabetes mellitus without complications: Secondary | ICD-10-CM

## 2011-09-28 LAB — LIPID PANEL
HDL: 51.1 mg/dL (ref >39.00)
LDL Cholesterol: 89 mg/dL (ref 0–99)
Total CHOL/HDL Ratio: 3
Triglycerides: 140 mg/dL (ref 0.0–149.0)

## 2011-09-28 LAB — BASIC METABOLIC PANEL
CO2: 25 mEq/L (ref 19–32)
Calcium: 9.2 mg/dL (ref 8.4–10.5)
Glucose, Bld: 100 mg/dL — ABNORMAL HIGH (ref 70–99)
Potassium: 4 mEq/L (ref 3.5–5.1)
Sodium: 138 mEq/L (ref 135–145)

## 2011-09-28 LAB — HEPATIC FUNCTION PANEL: Albumin: 4.1 g/dL (ref 3.5–5.2)

## 2011-09-28 LAB — MICROALBUMIN / CREATININE URINE RATIO
Creatinine,U: 124.2 mg/dL
Microalb Creat Ratio: 0.3 mg/g (ref 0.0–30.0)
Microalb, Ur: 0.4 mg/dL (ref 0.0–1.9)

## 2011-10-05 ENCOUNTER — Other Ambulatory Visit: Payer: 59

## 2011-10-12 ENCOUNTER — Ambulatory Visit: Payer: 59 | Admitting: Internal Medicine

## 2011-12-22 IMAGING — US US SOFT TISSUE HEAD/NECK
1 series · 14 of 25 positions shown · non-contrast
Comparison: None.

CLINICAL DATA: Thyroid mass found on exam.

THYROID ULTRASOUND
TECHNIQUE: Ultrasound examination of the thyroid gland and
adjacent soft tissues was performed.

[Series 1: us soft tissue head/neck · 0.05mm/px · 14 of 41 slices shown]
[im 1/41]
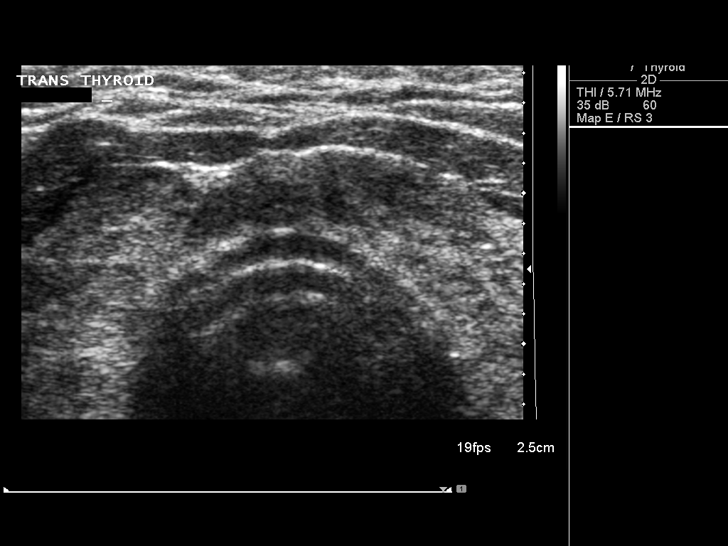
[im 4/41]
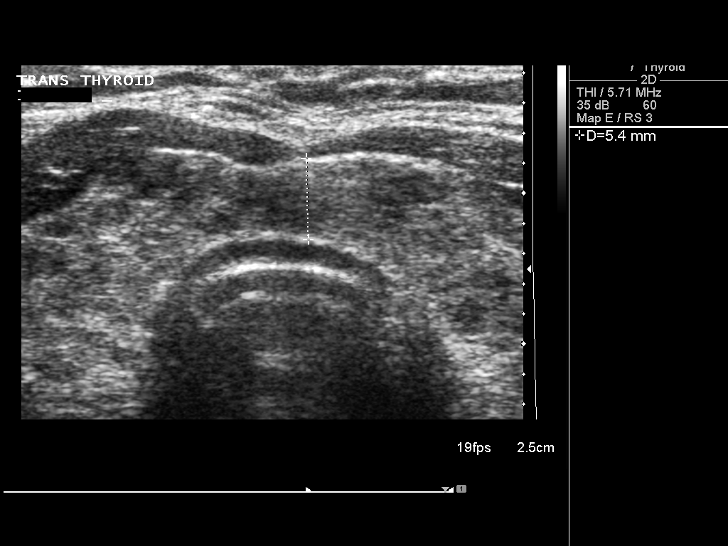
[im 7/41]
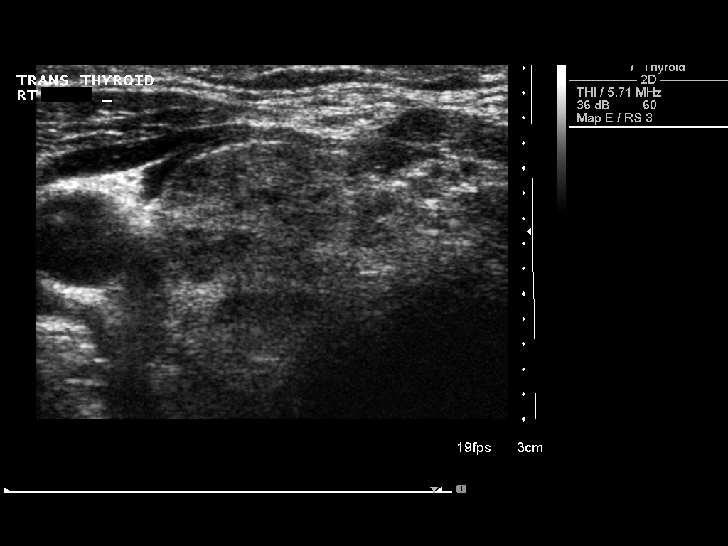
[im 11/41]
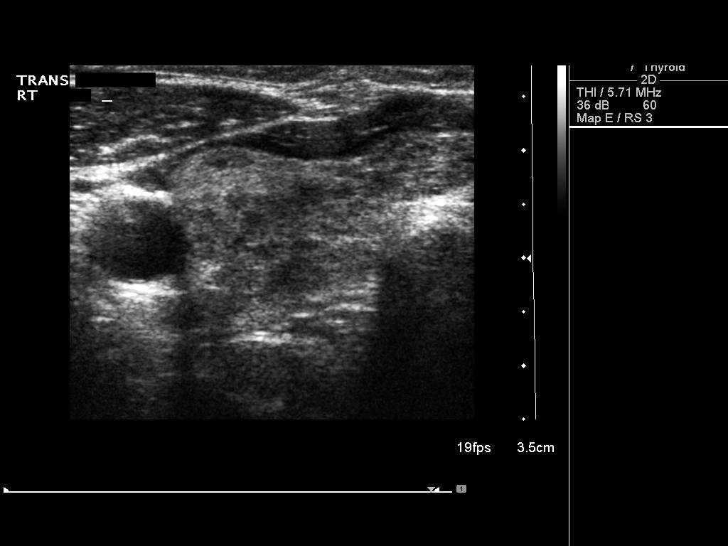
[im 14/41]
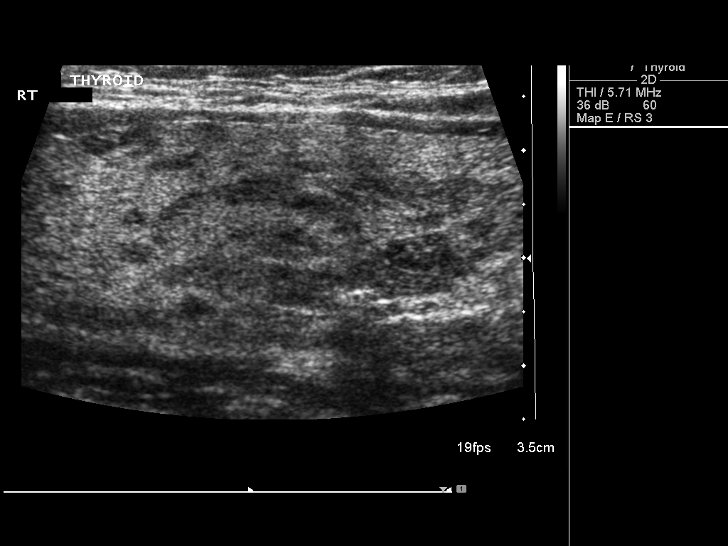
[im 16/41]
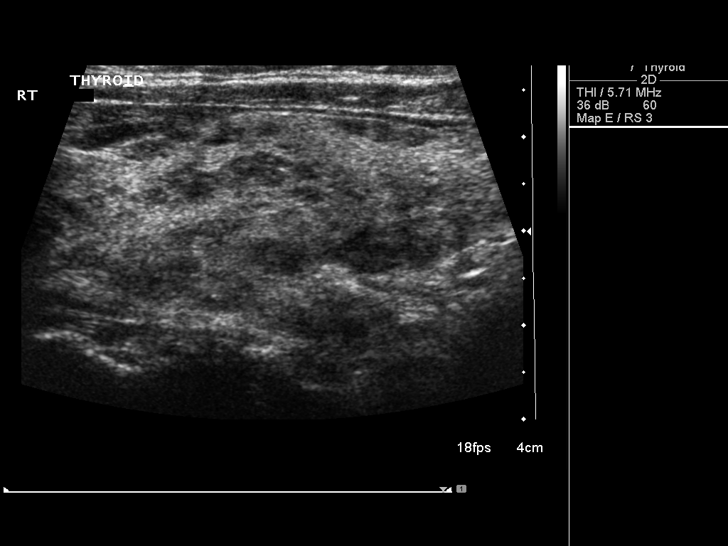
[im 19/41]
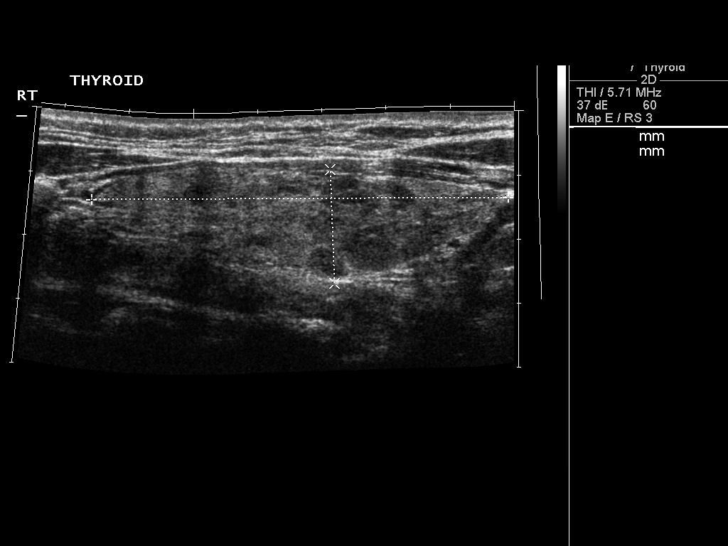
[im 22/41]
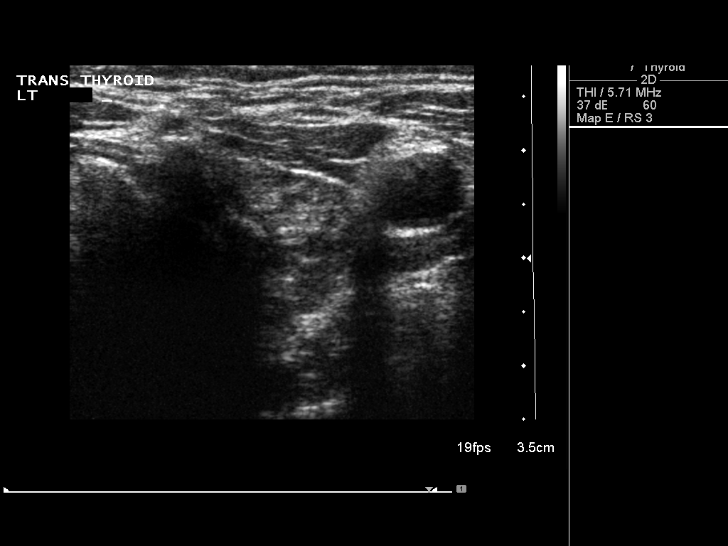
[im 26/41]
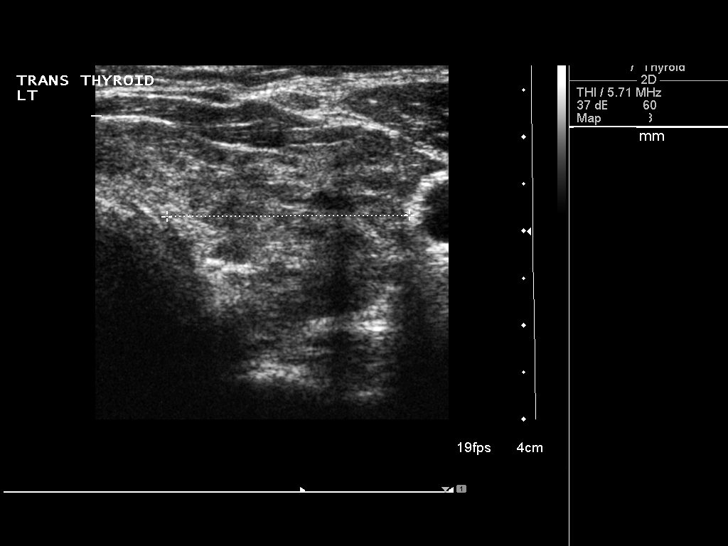
[im 27/41]
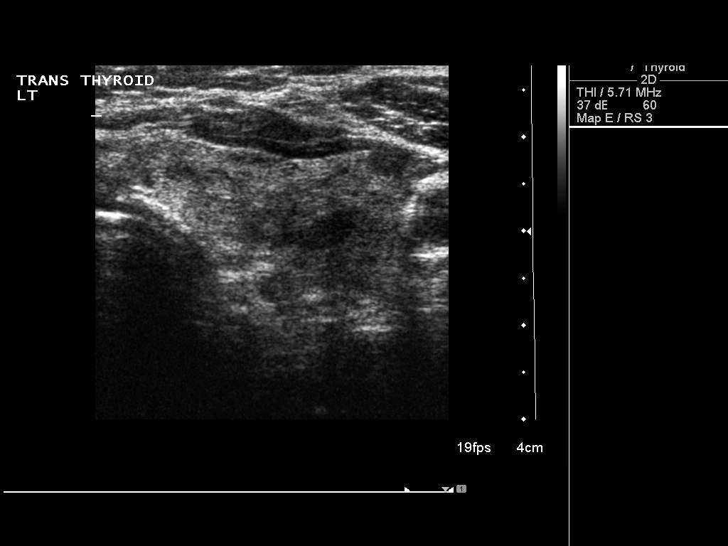
[im 31/41]
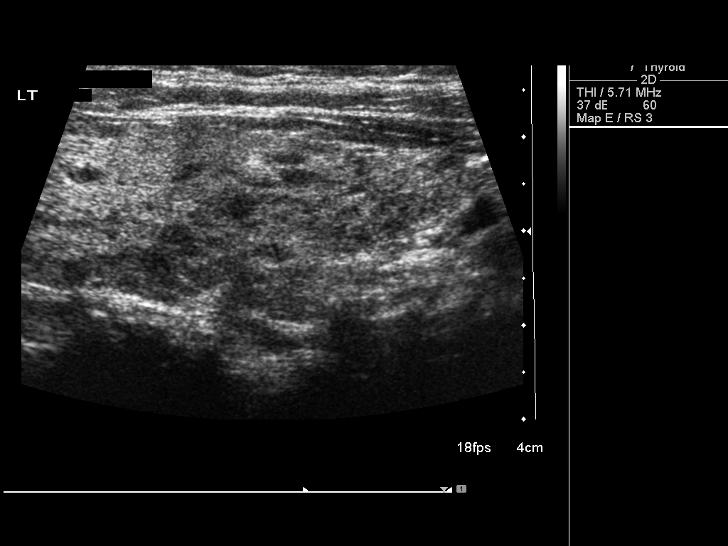
[im 34/41]
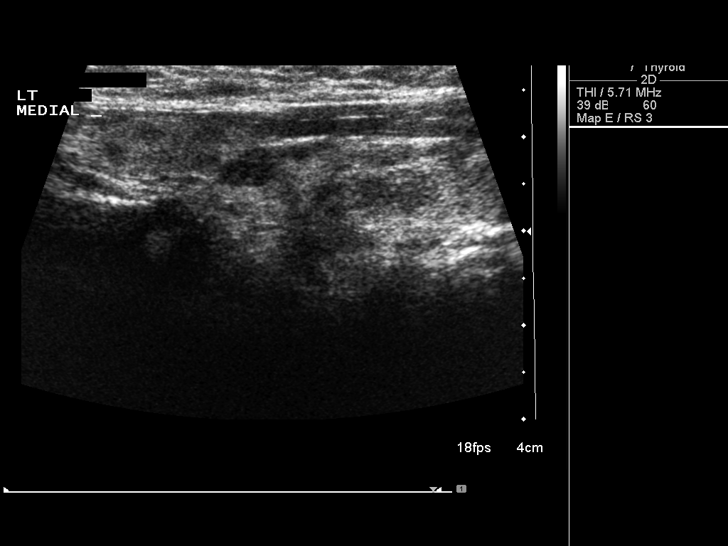
[im 37/41]
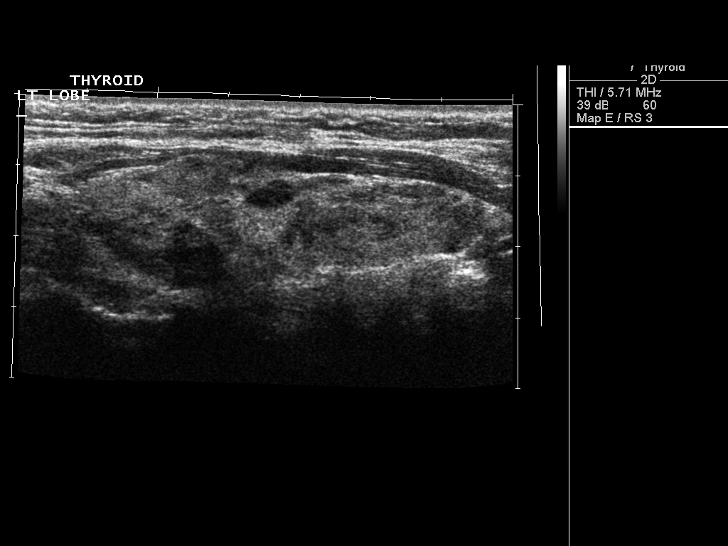
[im 41/41]
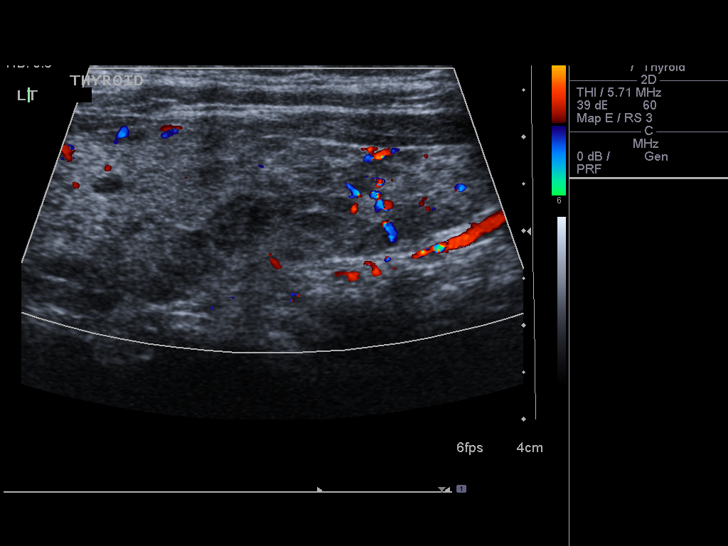

[14 of 25 positions shown; findings below may reference images not displayed]

FINDINGS: Right thyroid lobe measures 6.5 x 2.3 x 2.4 cm.  Left
thyroid lobe measures 6.2 x 2.0 x 2.6 cm.  Isthmus is 5 mm.

The thyroid gland is diffusely enlarged and heterogeneous.  No
focal well-defined nodule.
IMPRESSION: Diffusely enlarged and heterogeneous thyroid gland without well-
defined focal nodule.

## 2012-02-01 ENCOUNTER — Other Ambulatory Visit (INDEPENDENT_AMBULATORY_CARE_PROVIDER_SITE_OTHER): Payer: 59

## 2012-02-01 DIAGNOSIS — E119 Type 2 diabetes mellitus without complications: Secondary | ICD-10-CM

## 2012-02-01 LAB — MICROALBUMIN / CREATININE URINE RATIO
Creatinine,U: 74.9 mg/dL
Microalb, Ur: 0.3 mg/dL (ref 0.0–1.9)

## 2012-02-01 LAB — HEPATIC FUNCTION PANEL
ALT: 22 U/L (ref 0–53)
AST: 21 U/L (ref 0–37)
Bilirubin, Direct: 0.1 mg/dL (ref 0.0–0.3)
Total Bilirubin: 0.6 mg/dL (ref 0.3–1.2)

## 2012-02-01 LAB — LIPID PANEL
Cholesterol: 175 mg/dL (ref 0–200)
Total CHOL/HDL Ratio: 3
Triglycerides: 114 mg/dL (ref 0.0–149.0)

## 2012-02-01 LAB — BASIC METABOLIC PANEL
Calcium: 9.4 mg/dL (ref 8.4–10.5)
GFR: 82.51 mL/min (ref >60.00)
Sodium: 139 mEq/L (ref 135–145)

## 2012-02-06 ENCOUNTER — Other Ambulatory Visit: Payer: 59

## 2012-02-15 ENCOUNTER — Ambulatory Visit (INDEPENDENT_AMBULATORY_CARE_PROVIDER_SITE_OTHER): Payer: 59 | Admitting: Internal Medicine

## 2012-02-15 ENCOUNTER — Ambulatory Visit: Payer: 59 | Admitting: Internal Medicine

## 2012-02-15 ENCOUNTER — Encounter: Payer: Self-pay | Admitting: Internal Medicine

## 2012-02-15 VITALS — BP 124/78 | HR 72 | Temp 98.0°F | Ht 71.0 in | Wt 213.0 lb

## 2012-02-15 DIAGNOSIS — E785 Hyperlipidemia, unspecified: Secondary | ICD-10-CM

## 2012-02-15 DIAGNOSIS — E119 Type 2 diabetes mellitus without complications: Secondary | ICD-10-CM

## 2012-02-15 DIAGNOSIS — E049 Nontoxic goiter, unspecified: Secondary | ICD-10-CM

## 2012-02-15 LAB — HM DIABETES FOOT EXAM

## 2012-02-15 MED ORDER — METFORMIN HCL 500 MG PO TABS: 500.0000 mg | ORAL_TABLET | Freq: Two times a day (BID) | ORAL | Status: DC

## 2012-02-15 MED ORDER — GLIMEPIRIDE 2 MG PO TABS: 1.0000 mg | ORAL_TABLET | Freq: Every day | ORAL | Status: DC

## 2012-02-15 NOTE — Assessment & Plan Note (Signed)
Great control- Will decrease amaryl and metformin Congratulated on weight loss--- encouraged more of the same

## 2012-02-15 NOTE — Assessment & Plan Note (Signed)
Reviewed ultrasound

## 2012-02-15 NOTE — Progress Notes (Signed)
Patient ID: Juan Bennett, male   DOB: 1956/04/06, 56 y.o.   MRN: [id number]  patient comes in for followup of multiple medical problems including type 2 diabetes, hyperlipidemia, hypertension. The patient does not check blood sugar or blood pressure at home. The patetient does not follow an exercise or diet program. The patient denies any polyuria, polydipsia.  In the past the patient has gone to diabetic treatment center. The patient is tolerating medications  Without difficulty. The patient does admit to medication compliance.   Past Medical History  Diagnosis Date  . Allergy   . Hyperlipidemia   . Diabetes mellitus     History   Social History  . Marital Status: Married    Spouse Name: N/A    Number of Children: N/A  . Years of Education: N/A   Occupational History  . Not on file.   Social History Main Topics  . Smoking status: Former Smoker    Types: Cigarettes, Cigars  . Smokeless tobacco: Not on file  . Alcohol Use: 3.6 oz/week    6 Glasses of wine per week  . Drug Use: No  . Sexually Active: Not on file   Other Topics Concern  . Not on file   Social History Narrative  . No narrative on file    No past surgical history on file.  Family History  Problem Relation Age of Onset  . Diabetes Mother   . Deep vein thrombosis Father   . Diabetes Paternal Uncle     No Known Allergies  Current Outpatient Prescriptions on File Prior to Visit  Medication Sig Dispense Refill  . aspirin 325 MG tablet Take 325 mg by mouth daily.        Marland Kitchen glimepiride (AMARYL) 2 MG tablet TAKE 1 TABLET DAILY WITH   BREAKFAST  90 tablet  3  . metFORMIN (GLUCOPHAGE) 500 MG tablet Take 2 tablets (1,000 mg total) by mouth 2 (two) times daily with a meal.  360 tablet  3  . simvastatin (ZOCOR) 80 MG tablet Take 1 tablet (80 mg total) by mouth at bedtime.  90 tablet  3     patient denies chest pain, shortness of breath, orthopnea. Denies lower extremity edema, abdominal pain, change in  appetite, change in bowel movements. Patient denies rashes, musculoskeletal complaints. No other specific complaints in a complete review of systems.   There were no vitals taken for this visit.  well-developed well-nourished male in no acute distress. HEENT exam atraumatic, normocephalic, neck supple without jugular venous distention. Chest clear to auscultation cardiac exam S1-S2 are regular. Abdominal exam overweight with bowel sounds, soft and nontender. Extremities no edema. Neurologic exam is alert with a normal gait.

## 2012-02-15 NOTE — Assessment & Plan Note (Signed)
Continue same meds

## 2012-05-23 ENCOUNTER — Other Ambulatory Visit: Payer: Self-pay | Admitting: Internal Medicine

## 2012-05-31 ENCOUNTER — Other Ambulatory Visit: Payer: Self-pay | Admitting: Internal Medicine

## 2012-06-01 MED ORDER — GLIMEPIRIDE 2 MG PO TABS: 2.0000 mg | ORAL_TABLET | Freq: Every day | ORAL | Status: DC

## 2012-06-01 NOTE — Addendum Note (Signed)
Addended by: Alfred Levins D on: 06/01/2012 01:08 PM   Modules accepted: Orders

## 2012-08-10 ENCOUNTER — Other Ambulatory Visit (INDEPENDENT_AMBULATORY_CARE_PROVIDER_SITE_OTHER): Payer: 59

## 2012-08-10 DIAGNOSIS — E119 Type 2 diabetes mellitus without complications: Secondary | ICD-10-CM

## 2012-08-10 LAB — BASIC METABOLIC PANEL
CO2: 26 mEq/L (ref 19–32)
Calcium: 9.1 mg/dL (ref 8.4–10.5)
GFR: 96.71 mL/min (ref >60.00)
Glucose, Bld: 131 mg/dL — ABNORMAL HIGH (ref 70–99)
Potassium: 4 mEq/L (ref 3.5–5.1)
Sodium: 138 mEq/L (ref 135–145)

## 2012-08-10 LAB — LIPID PANEL
Cholesterol: 166 mg/dL (ref 0–200)
HDL: 44.8 mg/dL (ref >39.00)
Total CHOL/HDL Ratio: 4
Triglycerides: 169 mg/dL — ABNORMAL HIGH (ref 0.0–149.0)

## 2012-08-10 LAB — HEPATIC FUNCTION PANEL
AST: 24 U/L (ref 0–37)
Albumin: 4 g/dL (ref 3.5–5.2)
Alkaline Phosphatase: 63 U/L (ref 39–117)
Total Protein: 7.2 g/dL (ref 6.0–8.3)

## 2012-08-17 ENCOUNTER — Ambulatory Visit: Payer: 59 | Admitting: Internal Medicine

## 2012-08-20 ENCOUNTER — Ambulatory Visit (INDEPENDENT_AMBULATORY_CARE_PROVIDER_SITE_OTHER): Payer: 59 | Admitting: Family

## 2012-08-20 ENCOUNTER — Ambulatory Visit: Payer: 59 | Admitting: Internal Medicine

## 2012-08-20 ENCOUNTER — Encounter: Payer: Self-pay | Admitting: Family

## 2012-08-20 VITALS — BP 130/80 | HR 74 | Temp 97.7°F | Wt 233.0 lb

## 2012-08-20 DIAGNOSIS — E119 Type 2 diabetes mellitus without complications: Secondary | ICD-10-CM

## 2012-08-20 DIAGNOSIS — E7800 Pure hypercholesterolemia, unspecified: Secondary | ICD-10-CM

## 2012-08-20 NOTE — Patient Instructions (Addendum)

## 2012-08-20 NOTE — Progress Notes (Signed)
  Subjective:    Patient ID: Juan Bennett, male    DOB: February 20, 1956, 57 y.o.   MRN: [id number]  HPI 57 year old white male, nonsmoker, patient of Dr. Cato Mulligan is in for recheck of type 2 diabetes, hyperlipidemia. He is currently doing well on all medications. Denies any concerns today. Takes metformin, Amaryl, Zocor, and an aspirin daily. Lab results discussed.   Review of Systems  Constitutional: Negative.   HENT: Negative.   Cardiovascular: Negative.   Gastrointestinal: Negative.   Genitourinary: Negative.   Musculoskeletal: Negative.   Skin: Negative.   Neurological: Negative.   Hematological: Negative.   Psychiatric/Behavioral: Negative.    Past Medical History  Diagnosis Date  . Allergy   . Hyperlipidemia   . Diabetes mellitus     History   Social History  . Marital Status: Married    Spouse Name: N/A    Number of Children: N/A  . Years of Education: N/A   Occupational History  . Not on file.   Social History Main Topics  . Smoking status: Former Smoker    Types: Cigarettes, Cigars  . Smokeless tobacco: Not on file  . Alcohol Use: 3.6 oz/week    6 Glasses of wine per week  . Drug Use: No  . Sexually Active: Not on file   Other Topics Concern  . Not on file   Social History Narrative  . No narrative on file    No past surgical history on file.  Family History  Problem Relation Age of Onset  . Diabetes Mother   . Deep vein thrombosis Father   . Diabetes Paternal Uncle     No Known Allergies  Current Outpatient Prescriptions on File Prior to Visit  Medication Sig Dispense Refill  . aspirin 325 MG tablet Take 325 mg by mouth daily.        Marland Kitchen glimepiride (AMARYL) 2 MG tablet Take 1 tablet (2 mg total) by mouth daily before breakfast.  90 tablet  3  . metFORMIN (GLUCOPHAGE) 500 MG tablet Take 1 tablet (500 mg total) by mouth 2 (two) times daily with a meal.  360 tablet  3  . simvastatin (ZOCOR) 80 MG tablet Take 40 mg by mouth at bedtime.         BP 130/80  Pulse 74  Temp 97.7 F (36.5 C)  Wt 233 lb (105.688 kg)  SpO2 97%chart    Objective:   Physical Exam  Constitutional: He is oriented to person, place, and time. He appears well-developed and well-nourished.  HENT:  Right Ear: External ear normal.  Left Ear: External ear normal.  Nose: Nose normal.  Mouth/Throat: Oropharynx is clear and moist.  Neck: Normal range of motion. Neck supple.  Cardiovascular: Normal rate, regular rhythm and normal heart sounds.   Pulmonary/Chest: Effort normal and breath sounds normal.  Abdominal: Soft.  Musculoskeletal: Normal range of motion.  Neurological: He is alert and oriented to person, place, and time. He has normal reflexes.  Skin: Skin is warm and dry.  Psychiatric: He has a normal mood and affect.          Assessment & Plan:  Assessment: Type 2 diabetes, Hypercholesterolemia  Plan: Continue current medications. Encouraged healthy diet, exercise. Nightly feet checks. Diabetic eye exam annually. Advised patient call the office with any questions or concerns. Recheck in 6 months and sooner as needed. Her microalbumin sent.

## 2012-09-03 ENCOUNTER — Other Ambulatory Visit: Payer: Self-pay | Admitting: Internal Medicine

## 2013-01-22 LAB — HM DIABETES FOOT EXAM

## 2013-01-31 IMAGING — CR DG CHEST 1V
1 series · 1 of 1 positions shown · non-contrast
Comparison: None.

CLINICAL DATA: For physical exam

CHEST - 1 VIEW

[w chest pa]
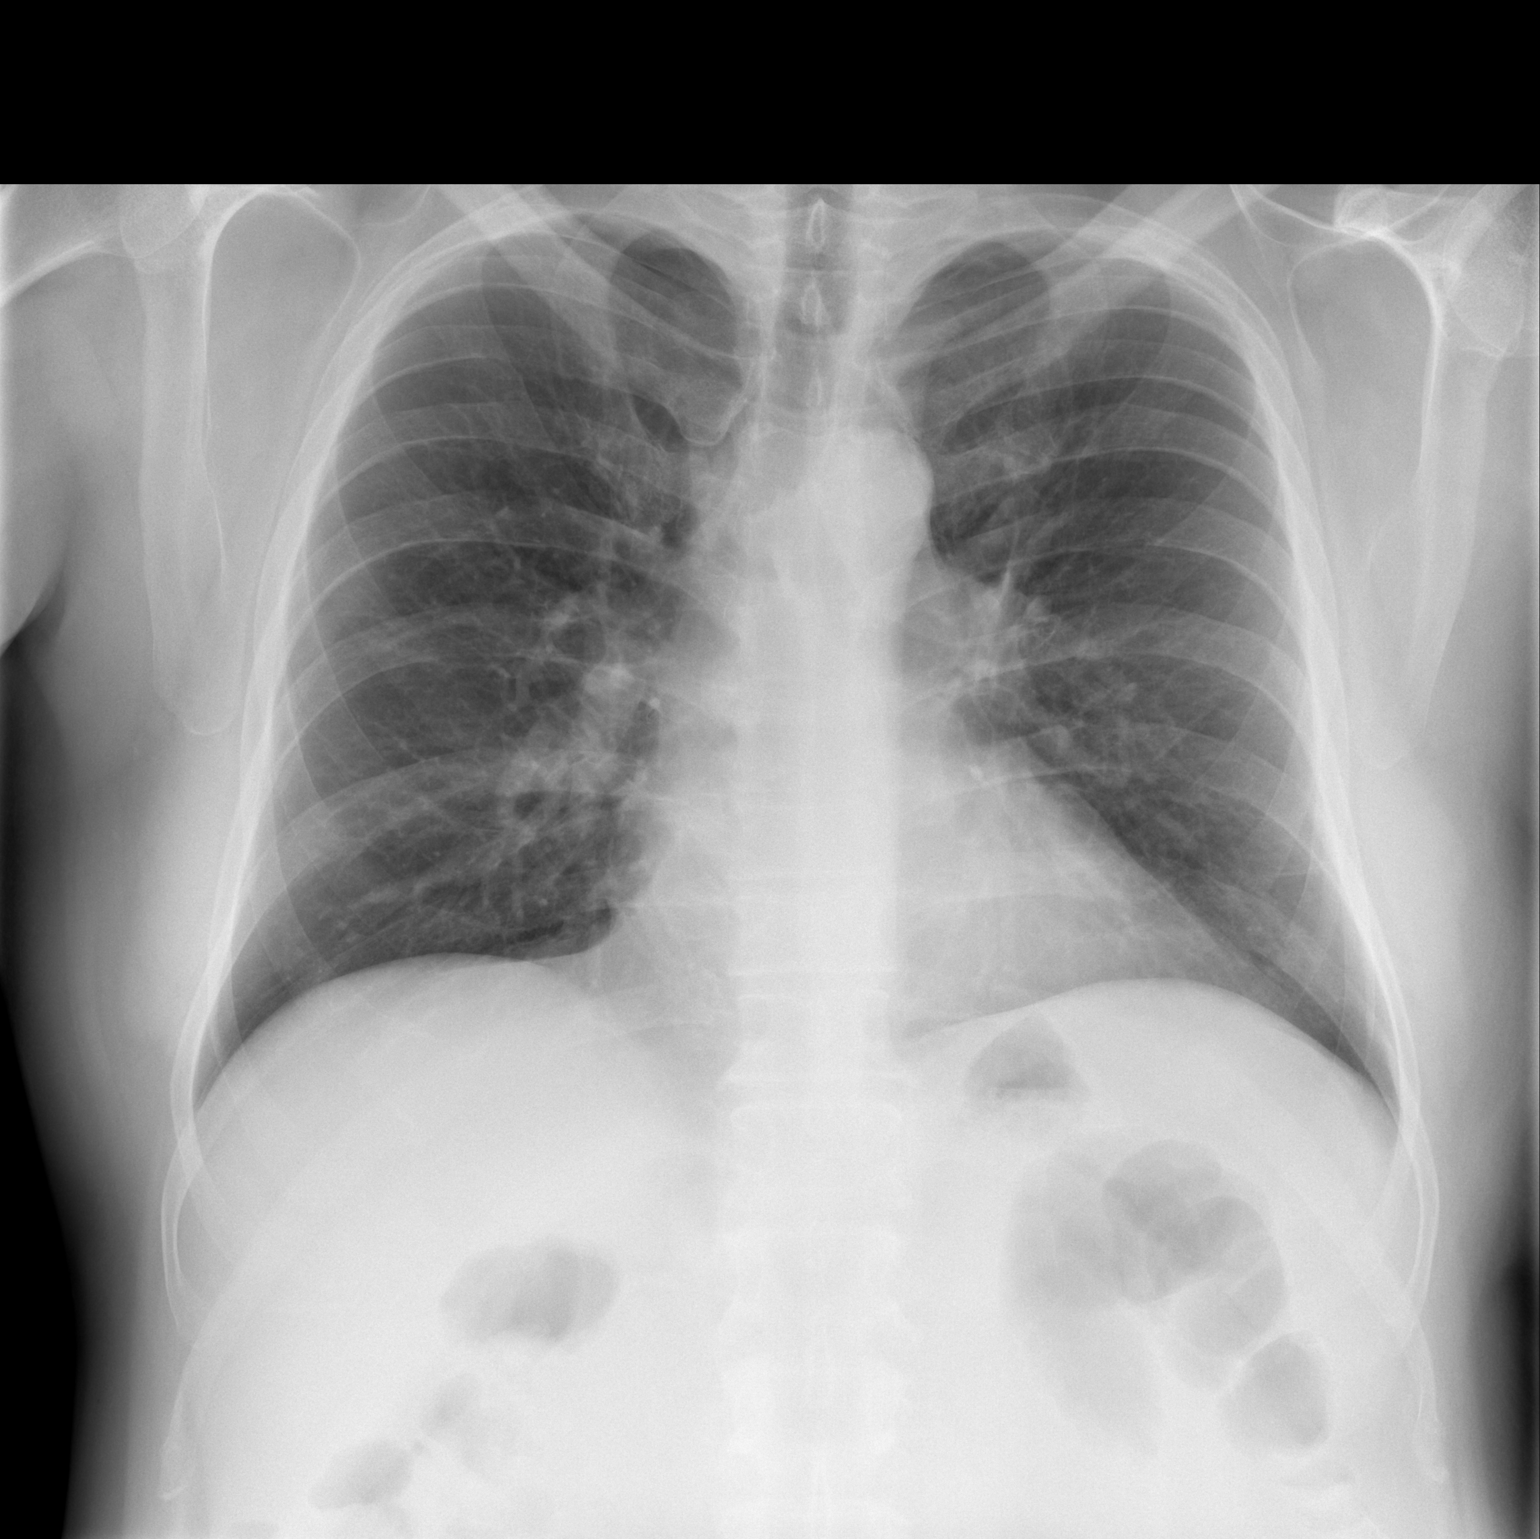

[1 of 1 positions shown; findings below may reference images not displayed]

FINDINGS: The lungs are clear.  Mediastinal contours appear normal.
The heart is within normal limits in size.  No bony abnormality is
seen.
IMPRESSION: No active lung disease.

## 2013-02-08 ENCOUNTER — Other Ambulatory Visit (INDEPENDENT_AMBULATORY_CARE_PROVIDER_SITE_OTHER): Payer: 59

## 2013-02-08 DIAGNOSIS — Z0000 Encounter for general adult medical examination without abnormal findings: Secondary | ICD-10-CM

## 2013-02-08 LAB — CBC WITH DIFFERENTIAL/PLATELET
Eosinophils Relative: 2.7 % (ref 0.0–5.0)
HCT: 43.2 % (ref 39.0–52.0)
Monocytes Relative: 10.5 % (ref 3.0–12.0)
Neutrophils Relative %: 53.8 % (ref 43.0–77.0)
Platelets: 189 10*3/uL (ref 150.0–400.0)
WBC: 5.5 10*3/uL (ref 4.5–10.5)

## 2013-02-08 LAB — POCT URINALYSIS DIPSTICK
Glucose, UA: NEGATIVE
Ketones, UA: NEGATIVE
Spec Grav, UA: >=1.03

## 2013-02-08 LAB — HEPATIC FUNCTION PANEL
ALT: 36 U/L (ref 0–53)
AST: 23 U/L (ref 0–37)
Bilirubin, Direct: 0.1 mg/dL (ref 0.0–0.3)
Total Bilirubin: 0.5 mg/dL (ref 0.3–1.2)

## 2013-02-08 LAB — MICROALBUMIN / CREATININE URINE RATIO
Creatinine,U: 191.3 mg/dL
Microalb, Ur: 0.5 mg/dL (ref 0.0–1.9)

## 2013-02-08 LAB — LIPID PANEL
Cholesterol: 185 mg/dL (ref 0–200)
Total CHOL/HDL Ratio: 4
Triglycerides: 147 mg/dL (ref 0.0–149.0)

## 2013-02-08 LAB — BASIC METABOLIC PANEL
Calcium: 9 mg/dL (ref 8.4–10.5)
Creatinine, Ser: 0.9 mg/dL (ref 0.4–1.5)
GFR: 99.16 mL/min (ref >60.00)
Sodium: 137 mEq/L (ref 135–145)

## 2013-02-11 ENCOUNTER — Other Ambulatory Visit: Payer: 59

## 2013-02-18 ENCOUNTER — Ambulatory Visit (INDEPENDENT_AMBULATORY_CARE_PROVIDER_SITE_OTHER): Payer: 59 | Admitting: Internal Medicine

## 2013-02-18 ENCOUNTER — Encounter: Payer: Self-pay | Admitting: Internal Medicine

## 2013-02-18 VITALS — BP 128/86 | HR 76 | Temp 98.2°F | Ht 72.0 in | Wt 228.0 lb

## 2013-02-18 DIAGNOSIS — E119 Type 2 diabetes mellitus without complications: Secondary | ICD-10-CM

## 2013-02-18 DIAGNOSIS — Z0000 Encounter for general adult medical examination without abnormal findings: Secondary | ICD-10-CM

## 2013-02-18 LAB — MICROALBUMIN / CREATININE URINE RATIO
Creatinine,U: 108.9 mg/dL
Microalb, Ur: 0.2 mg/dL (ref 0.0–1.9)

## 2013-02-18 NOTE — Addendum Note (Signed)
Addended by: Bonnye Fava on: 02/18/2013 10:06 AM   Modules accepted: Orders

## 2013-02-18 NOTE — Progress Notes (Signed)
Juan Bennett and   patient comes in for followup of multiple medical problems including type 2 diabetes, hyperlipidemia, hypertension. The patient does not check blood sugar or blood pressure at home. The patetient does not follow an exercise or diet program. The patient denies any polyuria, polydipsia.  In the past the patient has gone to diabetic treatment center. The patient is tolerating medications  Without difficulty. The patient does admit to medication compliance.   Past Medical History  Diagnosis Date  . Allergy   . Hyperlipidemia   . Diabetes mellitus     History   Social History  . Marital Status: Married    Spouse Name: N/A    Number of Children: N/A  . Years of Education: N/A   Occupational History  . Not on file.   Social History Main Topics  . Smoking status: Former Smoker    Types: Cigarettes, Cigars  . Smokeless tobacco: Not on file  . Alcohol Use: 3.6 oz/week    6 Glasses of wine per week  . Drug Use: No  . Sexually Active: Not on file   Other Topics Concern  . Not on file   Social History Narrative  . No narrative on file    No past surgical history on file.  Family History  Problem Relation Age of Onset  . Diabetes Mother   . Deep vein thrombosis Father   . Diabetes Paternal Uncle     No Known Allergies  Current Outpatient Prescriptions on File Prior to Visit  Medication Sig Dispense Refill  . aspirin 325 MG tablet Take 325 mg by mouth daily.        Marland Kitchen glimepiride (AMARYL) 2 MG tablet Take 1 tablet (2 mg total) by mouth daily before breakfast.  90 tablet  3  . metFORMIN (GLUCOPHAGE) 500 MG tablet TAKE 2 TABLETS (=1000MG )   TWO TIMES A DAY WITH A MEAL  360 tablet  3  . simvastatin (ZOCOR) 80 MG tablet TAKE 1 TABLET AT BEDTIME  90 tablet  3   No current facility-administered medications on file prior to visit.     patient denies chest pain, shortness of breath, orthopnea. Denies lower extremity edema, abdominal pain, change in appetite, change in  bowel movements. Patient denies rashes, musculoskeletal complaints. No other specific complaints in a complete review of systems.   BP 128/86  Pulse 76  Temp(Src) 98.2 F (36.8 C) (Oral)  Ht 6' (1.829 m)  Wt 228 lb (103.42 kg)  BMI 30.92 kg/m2   well-developed well-nourished male in no acute distress. HEENT exam atraumatic, normocephalic, neck supple without jugular venous distention. Chest clear to auscultation cardiac exam S1-S2 are regular. Abdominal exam overweight with bowel sounds, soft and nontender. Extremities no edema. Neurologic exam is alert with a normal gait.  Well visit- health maint UTD Discussed need for weight loss F/u 6 months

## 2013-05-06 ENCOUNTER — Encounter: Payer: Self-pay | Admitting: Family Medicine

## 2013-05-06 ENCOUNTER — Ambulatory Visit (INDEPENDENT_AMBULATORY_CARE_PROVIDER_SITE_OTHER): Payer: 59 | Admitting: Family Medicine

## 2013-05-06 VITALS — BP 140/80 | HR 88 | Temp 98.1°F | Wt 230.0 lb

## 2013-05-06 DIAGNOSIS — A084 Viral intestinal infection, unspecified: Secondary | ICD-10-CM

## 2013-05-06 DIAGNOSIS — A088 Other specified intestinal infections: Secondary | ICD-10-CM

## 2013-05-06 NOTE — Progress Notes (Signed)
  Subjective:    Patient ID: Juan Bennett, male    DOB: 09/26/1955, 57 y.o.   MRN: [id number]  HPI Here for 2 days of low grade fevers and diarrhea. No nausea or vomiting. Today he feels much better and the fever has resolved. He has taken no meds for this. No recent international; travel. No abdominal pain or cramps.    Review of Systems  Constitutional: Positive for fever.  HENT: Negative.   Respiratory: Negative.   Gastrointestinal: Positive for diarrhea. Negative for nausea, vomiting, abdominal pain, constipation, blood in stool, abdominal distention and rectal pain.       Objective:   Physical Exam  Constitutional: He appears well-developed and well-nourished.  Eyes: Conjunctivae are normal.  Neck: Neck supple. No thyromegaly present.  Pulmonary/Chest: Effort normal and breath sounds normal.  Abdominal: Soft. Bowel sounds are normal. He exhibits no distension and no mass. There is no tenderness. There is no rebound and no guarding.  Lymphadenopathy:    He has no cervical adenopathy.          Assessment & Plan:  Drink water and Gatorade. He seems to be over the worst of this.

## 2013-05-13 DIAGNOSIS — Z0279 Encounter for issue of other medical certificate: Secondary | ICD-10-CM

## 2013-06-19 ENCOUNTER — Other Ambulatory Visit: Payer: Self-pay | Admitting: Internal Medicine

## 2013-07-25 LAB — HM DIABETES EYE EXAM

## 2013-08-19 ENCOUNTER — Ambulatory Visit: Payer: 59 | Admitting: Internal Medicine

## 2013-08-21 ENCOUNTER — Ambulatory Visit: Payer: 59 | Admitting: Internal Medicine

## 2013-08-27 ENCOUNTER — Ambulatory Visit (INDEPENDENT_AMBULATORY_CARE_PROVIDER_SITE_OTHER): Payer: 59 | Admitting: Internal Medicine

## 2013-08-27 ENCOUNTER — Encounter: Payer: Self-pay | Admitting: Internal Medicine

## 2013-08-27 VITALS — BP 134/82 | HR 68 | Temp 97.6°F | Ht 72.0 in | Wt 229.0 lb

## 2013-08-27 DIAGNOSIS — E049 Nontoxic goiter, unspecified: Secondary | ICD-10-CM

## 2013-08-27 DIAGNOSIS — E785 Hyperlipidemia, unspecified: Secondary | ICD-10-CM

## 2013-08-27 DIAGNOSIS — E119 Type 2 diabetes mellitus without complications: Secondary | ICD-10-CM

## 2013-08-27 DIAGNOSIS — E1169 Type 2 diabetes mellitus with other specified complication: Secondary | ICD-10-CM

## 2013-08-27 LAB — BASIC METABOLIC PANEL
BUN: 12 mg/dL (ref 6–23)
CO2: 22 mEq/L (ref 19–32)
Calcium: 9.3 mg/dL (ref 8.4–10.5)
Chloride: 106 mEq/L (ref 96–112)
Creatinine, Ser: 0.8 mg/dL (ref 0.4–1.5)
GFR: 110.93 mL/min (ref >60.00)
GLUCOSE: 132 mg/dL — AB (ref 70–99)
POTASSIUM: 3.9 meq/L (ref 3.5–5.1)
SODIUM: 135 meq/L (ref 135–145)

## 2013-08-27 LAB — LIPID PANEL
Cholesterol: 176 mg/dL (ref 0–200)
HDL: 48.2 mg/dL (ref >39.00)
LDL CALC: 97 mg/dL (ref 0–99)
TRIGLYCERIDES: 156 mg/dL — AB (ref 0.0–149.0)
Total CHOL/HDL Ratio: 4
VLDL: 31.2 mg/dL (ref 0.0–40.0)

## 2013-08-27 LAB — HEPATIC FUNCTION PANEL
ALBUMIN: 4.1 g/dL (ref 3.5–5.2)
ALT: 38 U/L (ref 0–53)
AST: 24 U/L (ref 0–37)
Alkaline Phosphatase: 76 U/L (ref 39–117)
Bilirubin, Direct: 0 mg/dL (ref 0.0–0.3)
TOTAL PROTEIN: 7.4 g/dL (ref 6.0–8.3)
Total Bilirubin: 0.7 mg/dL (ref 0.3–1.2)

## 2013-08-27 LAB — MICROALBUMIN / CREATININE URINE RATIO
CREATININE, U: 78.6 mg/dL
MICROALB/CREAT RATIO: 0.3 mg/g (ref 0.0–30.0)
Microalb, Ur: 0.2 mg/dL (ref 0.0–1.9)

## 2013-08-27 LAB — HEMOGLOBIN A1C: HEMOGLOBIN A1C: 7.3 % — AB (ref 4.6–6.5)

## 2013-08-27 NOTE — Assessment & Plan Note (Signed)
Will check labs today Continue current meds

## 2013-08-27 NOTE — Progress Notes (Signed)
Pre visit review using our clinic review tool, if applicable. No additional management support is needed unless otherwise documented below in the visit note. 

## 2013-08-27 NOTE — Assessment & Plan Note (Signed)
Will check labs today

## 2013-08-27 NOTE — Assessment & Plan Note (Signed)
Reviewed the previous ultrasound

## 2013-08-27 NOTE — Progress Notes (Signed)
patient comes in for followup of multiple medical problems including type 2 diabetes, hyperlipidemia, hypertension. The patient does not check blood sugar or blood pressure at home. The patetient does not follow an exercise or diet program. The patient denies any polyuria, polydipsia.  In the past the patient has gone to diabetic treatment center. The patient is tolerating medications  Without difficulty. The patient does admit to medication compliance.   He does admit to mild left shoulder pain without injury  Past Medical History  Diagnosis Date  . Allergy   . Hyperlipidemia   . Diabetes mellitus     History   Social History  . Marital Status: Married    Spouse Name: N/A    Number of Children: N/A  . Years of Education: N/A   Occupational History  . Not on file.   Social History Main Topics  . Smoking status: Former Smoker    Types: Cigarettes, Cigars  . Smokeless tobacco: Never Used  . Alcohol Use: 0.0 oz/week     Comment: weekends  . Drug Use: No  . Sexual Activity: Not on file   Other Topics Concern  . Not on file   Social History Narrative  . No narrative on file    No past surgical history on file.  Family History  Problem Relation Age of Onset  . Diabetes Mother   . Deep vein thrombosis Father   . Diabetes Paternal Uncle     No Known Allergies  Current Outpatient Prescriptions on File Prior to Visit  Medication Sig Dispense Refill  . aspirin 325 MG tablet Take 325 mg by mouth daily.        Marland Kitchen glimepiride (AMARYL) 2 MG tablet TAKE 1 TABLET DAILY BEFORE BREAKFAST.  90 tablet  3  . metFORMIN (GLUCOPHAGE) 500 MG tablet TAKE 2 TABLETS (=1000MG )   TWO TIMES A DAY WITH A MEAL  360 tablet  3  . simvastatin (ZOCOR) 80 MG tablet        No current facility-administered medications on file prior to visit.     patient denies chest pain, shortness of breath, orthopnea. Denies lower extremity edema, abdominal pain, change in appetite, change in bowel movements.  Patient denies rashes, musculoskeletal complaints. No other specific complaints in a complete review of systems.   Reviewed vitals  well-developed well-nourished male in no acute distress. HEENT exam atraumatic, normocephalic, neck supple without jugular venous distention. Chest clear to auscultation cardiac exam S1-S2 are regular. Abdominal exam overweight with bowel sounds, soft and nontender. Extremities no edema. Neurologic exam is alert with a normal gait.

## 2013-08-30 ENCOUNTER — Telehealth: Payer: Self-pay

## 2013-08-30 NOTE — Telephone Encounter (Signed)
Relevant patient education assigned to patient using Emmi. ° °

## 2013-09-18 ENCOUNTER — Telehealth: Payer: Self-pay | Admitting: *Deleted

## 2013-09-18 NOTE — Telephone Encounter (Signed)
Form was faxed 

## 2013-09-18 NOTE — Telephone Encounter (Signed)
Patient came in January for an office visit and brought a form to fill out.  It had vitals etc from his CPX for [facility].  He would like to know if this was faxed?

## 2013-09-27 ENCOUNTER — Other Ambulatory Visit: Payer: Self-pay | Admitting: Internal Medicine

## 2013-10-17 ENCOUNTER — Encounter: Payer: Self-pay | Admitting: Internal Medicine

## 2014-02-20 ENCOUNTER — Encounter: Payer: Self-pay | Admitting: Family Medicine

## 2014-02-20 ENCOUNTER — Ambulatory Visit (INDEPENDENT_AMBULATORY_CARE_PROVIDER_SITE_OTHER): Payer: 59 | Admitting: Family Medicine

## 2014-02-20 VITALS — BP 122/72 | HR 68 | Temp 97.9°F | Wt 222.0 lb

## 2014-02-20 DIAGNOSIS — B356 Tinea cruris: Secondary | ICD-10-CM

## 2014-02-20 DIAGNOSIS — E049 Nontoxic goiter, unspecified: Secondary | ICD-10-CM

## 2014-02-20 DIAGNOSIS — E785 Hyperlipidemia, unspecified: Secondary | ICD-10-CM

## 2014-02-20 DIAGNOSIS — Z23 Encounter for immunization: Secondary | ICD-10-CM

## 2014-02-20 DIAGNOSIS — E119 Type 2 diabetes mellitus without complications: Secondary | ICD-10-CM

## 2014-02-20 LAB — TSH: TSH: 1.34 u[IU]/mL (ref 0.35–4.50)

## 2014-02-20 LAB — HEMOGLOBIN A1C: Hgb A1c MFr Bld: 6.9 % — ABNORMAL HIGH (ref 4.6–6.5)

## 2014-02-20 MED ORDER — CLOTRIMAZOLE 1 % EX CREA: 1.0000 "application " | TOPICAL_CREAM | Freq: Two times a day (BID) | CUTANEOUS | Status: DC

## 2014-02-20 NOTE — Progress Notes (Signed)
Juan Reddish, MD Phone: [phone]  Subjective:  Patient presents today to establish care with me as PCP. Chief complaint-noted.   Jock itch Has used nystatin/trimcinolone cream for jock itch intermittently.  Last time 2 years ago. Symptoms x 1 week.  ROS-not ill appearing, no fever/chills. No new medications. Not immunocompromised. No mucus membrane involvement.   Hyperlipidemia On statin: yes Regular exercise: no ROS- no chest pain or shortness of breath. No myalgias  Goiter On thyroid medication-no. No recent changes to size.  ROS-No hair or nail changes. No heat/cold intolerance. No constipation or diarrhea. Denies shakiness or anxiety. No palpitations.   DIABETES Type II Medications taking and tolerating-yes Blood Sugars per patient-does not check Regular Exercise-virtually none. Recommended 150 minutes exercise per week.  Health Maintenance Due  Topic Date Due  . Pneumococcal Polysaccharide Vaccine (##2) 09/29/2013  . Foot Exam  01/22/2014  On Aspirin-yes On statin-yes Daily foot monitoring-yes  ROS- Denies Polyuria,Polydipsia, nocturia, Vision changes, feet or hand numbness/pain/tingling. Denies  Hypoglycemia symptoms (shaky, sweaty, hungry, weak anxious, tremor, palpitations, confusion, behavior change).   Hemoglobin a1c:  Lab Results  Component Value Date   HGBA1C 7.3* 08/27/2013   HGBA1C 6.8* 02/08/2013   HGBA1C 6.8* 08/10/2012   The following were reviewed and entered/updated in epic: Past Medical History  Diagnosis Date  . Allergy   . Hyperlipidemia   . Diabetes mellitus    Patient Active Problem List   Diagnosis Date Noted  . DIABETES-TYPE 2 09/04/2007    Priority: High  . Goiter 10/15/2009    Priority: Medium  . HYPERLIPIDEMIA 09/04/2007    Priority: Medium  . ALLERGIC RHINITIS 09/04/2007    Priority: Low   No past surgical history on file.  Family History  Problem Relation Age of Onset  . Diabetes Mother   . Deep vein thrombosis Father     . Diabetes Paternal Uncle     Medications- reviewed and updated Current Outpatient Prescriptions  Medication Sig Dispense Refill  . aspirin 325 MG tablet Take 325 mg by mouth daily.        Marland Kitchen glimepiride (AMARYL) 2 MG tablet TAKE 1 TABLET DAILY BEFORE BREAKFAST.  90 tablet  3  . metFORMIN (GLUCOPHAGE) 500 MG tablet TAKE 2 TABLETS (=1000MG )   TWO TIMES A DAY WITH MEALS  360 tablet  1  . simvastatin (ZOCOR) 80 MG tablet Take 80 mg by mouth at bedtime.       . clotrimazole (LOTRIMIN) 1 % cream Apply 1 application topically 2 (two) times daily.  113 g  0   No current facility-administered medications for this visit.    Allergies-reviewed and updated No Known Allergies  History   Social History  . Marital Status: Married    Spouse Name: N/A    Number of Children: N/A  . Years of Education: N/A   Social History Main Topics  . Smoking status: Former Smoker    Types: Cigarettes, Cigars  . Smokeless tobacco: Never Used     Comment: occasional cigars 6-8 per year  . Alcohol Use: 0.0 oz/week     Comment: weekends. 4-6 mixed drinks over  . Drug Use: No  . Sexual Activity: None   Other Topics Concern  . None   Social History Narrative   Married and has rising senior 01/2014 daughter. Married since 62. Plans to retire around 57.    Work at [employer] long time. Rotating shifts currently. Walks some more at walk.   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 Oropharynx normal Neck: goiter noted CV: RRR no murmurs rubs or gallops Lungs: CTAB no crackles, wheeze, rhonchi Abdomen: soft/nontender/nondistended/normal bowel sounds. No rebound or guarding.  Ext: no edema Skin: warm, dry with exception of raised erythematous rash in groin with satellite lesions Neuro:  grossly normal, moves all extremities, PERRLA DM foot exam normal  Assessment/Plan:  Jock itch/tinea cruris Clotrimazole rx given advised minimum 1 week treatment  DIABETES-TYPE 2 Last a1c 7.3 but patient has lost 7 lbs since that time. Hopeful a1c <7, otherwise may need to increase amaryl. Stressed lifestyle changes to patient. Check a1c today. Foot exam and pneumovax completed today.   Goiter Check TSH today. Goiter unchanged on exam.   HYPERLIPIDEMIA Well controlled on simvastatin. No myalgias-continue current dose.    Orders Placed This Encounter  Procedures  . Pneumococcal polysaccharide vaccine 23-valent greater than or equal to 2yo subcutaneous/IM  . Hemoglobin A1c    [place]  . TSH    [place]    Meds ordered this encounter  Medications  . clotrimazole (LOTRIMIN) 1 % cream    Sig: Apply 1 application topically 2 (two) times daily.    Dispense:  113 g    Refill:  0

## 2014-02-20 NOTE — Patient Instructions (Signed)
Diabetes  Check a1c  Continue current medications  Cholesterol  Continue statin at current dose  Jock itch  Use clotrimazole 1% twice a day at least a week and at least 2 days past area is better  Goiter  Check thyroid  Health Maintenance Due  Topic Date Due  . Pneumococcal Polysaccharide Vaccine (##2)-today 09/29/2013  . Foot Exam - today 01/22/2014

## 2014-02-20 NOTE — Assessment & Plan Note (Signed)
Well controlled on simvastatin. No myalgias-continue current dose.

## 2014-02-20 NOTE — Assessment & Plan Note (Signed)
Check TSH today. Goiter unchanged on exam.

## 2014-02-20 NOTE — Assessment & Plan Note (Addendum)
Last a1c 7.3 but patient has lost 7 lbs since that time. Hopeful a1c <7, otherwise may need to increase amaryl. Stressed lifestyle changes to patient. Check a1c today. Foot exam and pneumovax completed today.

## 2014-02-28 ENCOUNTER — Ambulatory Visit: Payer: 59 | Admitting: Internal Medicine

## 2014-03-01 ENCOUNTER — Ambulatory Visit (INDEPENDENT_AMBULATORY_CARE_PROVIDER_SITE_OTHER): Payer: 59 | Admitting: Family Medicine

## 2014-03-01 ENCOUNTER — Encounter: Payer: Self-pay | Admitting: Family Medicine

## 2014-03-01 VITALS — BP 150/98 | Temp 98.3°F | Wt 224.0 lb

## 2014-03-01 DIAGNOSIS — M5459 Other low back pain: Secondary | ICD-10-CM

## 2014-03-01 DIAGNOSIS — M545 Low back pain, unspecified: Secondary | ICD-10-CM

## 2014-03-01 MED ORDER — CYCLOBENZAPRINE HCL 10 MG PO TABS: 10.0000 mg | ORAL_TABLET | Freq: Three times a day (TID) | ORAL | Status: DC | PRN

## 2014-03-01 NOTE — Progress Notes (Signed)
Pre visit review using our clinic review tool, if applicable. No additional management support is needed unless otherwise documented below in the visit note. 

## 2014-03-01 NOTE — Progress Notes (Signed)
OFFICE VISIT  03/01/2014   CC:  Chief Complaint  Patient presents with  . Back Pain    x 1 week    HPI:    Patient is a 58 y.o. Caucasian male who presents for back pain. Onset 1-2 wks ago, seems to wax and wane--almost completely go away at times. Hurts all across lower back.  No radiation.  No paresthesias.   No trauma.  No excessive back strain, repetitive bending, etc lately. Alleve has been tried on/off.  Seemed to help more when this first started but not as clear lately. Not currently hurting in back today.  Past Medical History  Diagnosis Date  . Allergy   . Hyperlipidemia   . Diabetes mellitus     No past surgical history on file.  Outpatient Prescriptions Prior to Visit  Medication Sig Dispense Refill  . aspirin 325 MG tablet Take 325 mg by mouth daily.        . clotrimazole (LOTRIMIN) 1 % cream Apply 1 application topically 2 (two) times daily.  113 g  0  . glimepiride (AMARYL) 2 MG tablet TAKE 1 TABLET DAILY BEFORE BREAKFAST.  90 tablet  3  . metFORMIN (GLUCOPHAGE) 500 MG tablet TAKE 2 TABLETS (=1000MG )   TWO TIMES A DAY WITH MEALS  360 tablet  1  . simvastatin (ZOCOR) 80 MG tablet Take 80 mg by mouth at bedtime.        No facility-administered medications prior to visit.    No Known Allergies  ROS As per HPI  PE: Blood pressure 150/98, temperature 98.3 F (36.8 C), temperature source Oral, weight 224 lb (101.606 kg). Gen: Alert, well appearing.  Patient is oriented to person, place, time, and situation. BACK: L spine ROM fully intact w/out pain. Back w/out deformity.  Back nontender, no bruising or erythema. Sitting SLR neg bilat.  LE strength 5/5 prox and dist. I cannot elicit any DTRs in patellar or achilles areas on either leg.  LABS:  none  IMPRESSION AND PLAN:  Mechanical Low back pain, acute. Reassured pt. Alleve 440mg  bid with food x 10d.  Relative rest. Flexeril 10mg  q8h prn, #30, RF x 1.  Therapeutic expectations and side effect  profile of medication discussed today.  Patient's questions answered. Light duty at work if possible.  An After Visit Summary was printed and given to the patient.   FOLLOW UP: prn

## 2014-03-01 NOTE — Patient Instructions (Signed)
Take 2 OTC alleve tabs twice a day with food for 10 days.

## 2014-03-06 ENCOUNTER — Ambulatory Visit: Payer: 59 | Admitting: Family Medicine

## 2014-03-07 ENCOUNTER — Encounter: Payer: 59 | Admitting: Internal Medicine

## 2014-03-07 ENCOUNTER — Ambulatory Visit: Payer: 59 | Admitting: Internal Medicine

## 2014-04-04 ENCOUNTER — Telehealth: Payer: Self-pay | Admitting: *Deleted

## 2014-04-04 NOTE — Telephone Encounter (Signed)
Spoke with patient and he will call back to schedule a nurse visit for a bp check.

## 2014-05-20 ENCOUNTER — Encounter: Payer: Self-pay | Admitting: Family Medicine

## 2014-07-30 ENCOUNTER — Ambulatory Visit (INDEPENDENT_AMBULATORY_CARE_PROVIDER_SITE_OTHER): Payer: 59 | Admitting: Family Medicine

## 2014-07-30 ENCOUNTER — Encounter: Payer: Self-pay | Admitting: Family Medicine

## 2014-07-30 VITALS — BP 110/62 | HR 69 | Ht 71.0 in | Wt 231.0 lb

## 2014-07-30 DIAGNOSIS — E049 Nontoxic goiter, unspecified: Secondary | ICD-10-CM

## 2014-07-30 DIAGNOSIS — B356 Tinea cruris: Secondary | ICD-10-CM | POA: Insufficient documentation

## 2014-07-30 DIAGNOSIS — E785 Hyperlipidemia, unspecified: Secondary | ICD-10-CM

## 2014-07-30 DIAGNOSIS — E119 Type 2 diabetes mellitus without complications: Secondary | ICD-10-CM

## 2014-07-30 DIAGNOSIS — IMO0001 Reserved for inherently not codable concepts without codable children: Secondary | ICD-10-CM

## 2014-07-30 DIAGNOSIS — Z0000 Encounter for general adult medical examination without abnormal findings: Secondary | ICD-10-CM

## 2014-07-30 LAB — COMPREHENSIVE METABOLIC PANEL
ALK PHOS: 75 U/L (ref 39–117)
ALT: 72 U/L — AB (ref 0–53)
AST: 42 U/L — AB (ref 0–37)
Albumin: 4.3 g/dL (ref 3.5–5.2)
BUN: 11 mg/dL (ref 6–23)
CHLORIDE: 107 meq/L (ref 96–112)
CO2: 24 meq/L (ref 19–32)
CREATININE: 0.9 mg/dL (ref 0.4–1.5)
Calcium: 9.2 mg/dL (ref 8.4–10.5)
GFR: 91.17 mL/min (ref >60.00)
Glucose, Bld: 168 mg/dL — ABNORMAL HIGH (ref 70–99)
POTASSIUM: 3.8 meq/L (ref 3.5–5.1)
SODIUM: 136 meq/L (ref 135–145)
Total Bilirubin: 0.8 mg/dL (ref 0.2–1.2)
Total Protein: 7.4 g/dL (ref 6.0–8.3)

## 2014-07-30 LAB — LIPID PANEL
CHOLESTEROL: 198 mg/dL (ref 0–200)
HDL: 45.8 mg/dL (ref >39.00)
LDL Cholesterol: 116 mg/dL — ABNORMAL HIGH (ref 0–99)
NONHDL: 152.2
TRIGLYCERIDES: 181 mg/dL — AB (ref 0.0–149.0)
Total CHOL/HDL Ratio: 4
VLDL: 36.2 mg/dL (ref 0.0–40.0)

## 2014-07-30 LAB — TSH: TSH: 2.93 u[IU]/mL (ref 0.35–4.50)

## 2014-07-30 LAB — POCT URINALYSIS DIPSTICK
Bilirubin, UA: NEGATIVE
Glucose, UA: NEGATIVE
KETONES UA: NEGATIVE
LEUKOCYTES UA: NEGATIVE
Nitrite, UA: NEGATIVE
Protein, UA: NEGATIVE
RBC UA: NEGATIVE
Spec Grav, UA: 1.02
UROBILINOGEN UA: 1
pH, UA: 5.5

## 2014-07-30 LAB — CBC
HEMATOCRIT: 45.2 % (ref 39.0–52.0)
Hemoglobin: 14.9 g/dL (ref 13.0–17.0)
MCHC: 33.1 g/dL (ref 30.0–36.0)
MCV: 89 fl (ref 78.0–100.0)
Platelets: 197 10*3/uL (ref 150.0–400.0)
RBC: 5.08 Mil/uL (ref 4.22–5.81)
RDW: 13.6 % (ref 11.5–15.5)
WBC: 4.9 10*3/uL (ref 4.0–10.5)

## 2014-07-30 LAB — MICROALBUMIN / CREATININE URINE RATIO
CREATININE, U: 211.5 mg/dL
Microalb Creat Ratio: 0.6 mg/g (ref 0.0–30.0)
Microalb, Ur: 1.3 mg/dL (ref 0.0–1.9)

## 2014-07-30 LAB — HEMOGLOBIN A1C: Hgb A1c MFr Bld: 7.9 % — ABNORMAL HIGH (ref 4.6–6.5)

## 2014-07-30 NOTE — Assessment & Plan Note (Signed)
Controlled previously with a1c 6.9. Concerned may have worsened with weight increase and sedentary activity. Continue metformin 1g BID, amaryl 2mg  until a1c, may increase amaryl to 4mg  and discussed hypoglycemia if we do this.

## 2014-07-30 NOTE — Progress Notes (Signed)
Juan Reddish, MD Phone: [phone]  Subjective:  Patient presents today for annual physical. Chief complaint-noted.   Patient has been doing well. He retired early at age 59 but was planning on age 71. He is happy about this but admits to less physical activity since retiring. He knows he needs to start an exercise program. Has intermittent jock itch flarea and has clotrimazole on hand. His diabetes has been reasonably controlled on metformin and amaryl with weight 7-10 lbs later and he is aware it may be worse. He does not check sugars but denies any lows. Hyperlipidemia has done well on simvastatin and been controlled. He is fasting today. TSH has been normal despite goiter.   ROS- Full ROS completed and negative. Specifically no hypoglycemia, chest pain, shortness of breath, dyspnea on exertion, fevers, chills, unintentional weight loss, fatigue, hair or nail changs.   The following were reviewed and entered/updated in epic: Past Medical History  Diagnosis Date  . Allergy   . Hyperlipidemia   . Diabetes mellitus    Patient Active Problem List   Diagnosis Date Noted  . Diabetes mellitus type II, controlled 09/04/2007    Priority: High  . Goiter 10/15/2009    Priority: Medium  . Hyperlipidemia 09/04/2007    Priority: Medium  . Jock itch 07/30/2014    Priority: Low  . ALLERGIC RHINITIS 09/04/2007    Priority: Low   No past surgical history on file.  Family History  Problem Relation Age of Onset  . Diabetes Mother   . Deep vein thrombosis Father   . Diabetes Paternal Uncle     Medications- reviewed and updated Current Outpatient Prescriptions  Medication Sig Dispense Refill  . aspirin 325 MG tablet Take 325 mg by mouth daily.      . clotrimazole (LOTRIMIN) 1 % cream Apply 1 application topically 2 (two) times daily. 113 g 0  . glimepiride (AMARYL) 2 MG tablet TAKE 1 TABLET DAILY BEFORE BREAKFAST. 90 tablet 3  . metFORMIN (GLUCOPHAGE) 500 MG tablet TAKE 2 TABLETS  (=1000MG )   TWO TIMES A DAY WITH MEALS 360 tablet 1  . simvastatin (ZOCOR) 80 MG tablet Take 80 mg by mouth at bedtime.      No current facility-administered medications for this visit.    Allergies-reviewed and updated No Known Allergies  History   Social History  . Marital Status: Married    Spouse Name: N/A    Number of Children: N/A  . Years of Education: N/A   Social History Main Topics  . Smoking status: Former Smoker    Types: Cigarettes, Cigars  . Smokeless tobacco: Never Used     Comment: occasional cigars 6-8 per year. quit many years ago.   . Alcohol Use: 0.0 oz/week    0 Not specified per week     Comment: weekends. 4-6 mixed drinks over the full weekend  . Drug Use: No  . Sexual Activity: Not on file   Other Topics Concern  . Not on file   Social History Narrative   Married and has rising senior 01/2014 daughter. Married since 67. Retired age 61.    Work at [employer] long time. Rotating shifts currently. Walks some more at walk.   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 Oropharynx normal. Good dentition Neck: stable thyromegaly CV: RRR no murmurs rubs or gallops Lungs: CTAB no crackles, wheeze, rhonchi Abdomen: soft/nontender/nondistended/normal bowel sounds. No rebound or guarding. No hepatospelomegaly Ext: no edema, 2+ PT pulses Skin: warm, dry, no rash-multiple seborrheic keratosis but no actinic keratosis or other suspicious lesions on trunk.  Neuro: grossly normal, moves all extremities, PERRLA, 5/5 strength upper and lower extremities.   Assessment/Plan:  59 y.o. male presenting for annual physical.  Health Maintenance counseling: 1. Anticipatory guidance: Patient counseled regarding regular dental  exams, wearing seatbelts.  2. Risk factor reduction:  Advised patient of need for regular exercise and diet rich and fruits and vegetables to reduce risk of heart attack and stroke. Really focused on need for exercise in this patient as already eating reasonable diet.  3. Immunizations/screenings/ancillary studies- flu shot in November, otherwise upto date  Diabetes mellitus type II, controlled Controlled previously with a1c 6.9. Concerned may have worsened with weight increase and sedentary activity. Continue metformin 1g BID, amaryl 2mg  until a1c, may increase amaryl to 4mg  and discussed hypoglycemia if we do this.   Goiter Stable on exam. Check TSH.   Hyperlipidemia Controlled with simvastatin. Update a1c today.   4 month follow up suggested with weight gain  fasting Orders Placed This Encounter  Procedures  . CBC    [place]  . Comprehensive metabolic panel    [place]    Order Specific Question:  Has the patient fasted?    Answer:  No  . Lipid panel    [place]    Order Specific Question:  Has the patient fasted?    Answer:  No  . Hemoglobin A1c    [place]  . TSH    [place]  . Microalbumin / creatinine urine ratio    [place]  . POCT urinalysis dipstick

## 2014-07-30 NOTE — Assessment & Plan Note (Signed)
Controlled with simvastatin. Update a1c today.

## 2014-07-30 NOTE — Assessment & Plan Note (Signed)
Stable on exam. Check TSH.

## 2014-07-30 NOTE — Patient Instructions (Signed)
Update fasting labs today.   Congrats on retirement!   Now you just need to get into a regular exercise program. Weight is trending up and diabetes may be doing the same.   If a1c is above 7, we may increase your amaryl. See information below to watch out for for low blood sugar if we do that.   Thanks for already getting your flu shot!   Hypoglycemia Hypoglycemia occurs when the glucose in your blood is too low. Glucose is a type of sugar that is your body's main energy source. Hormones, such as insulin and glucagon, control the level of glucose in the blood. Insulin lowers blood glucose and glucagon increases blood glucose. Having too much insulin in your blood stream, or not eating enough food containing sugar, can result in hypoglycemia. Hypoglycemia can happen to people with or without diabetes. It can develop quickly and can be a medical emergency.  CAUSES   Missing or delaying meals.  Not eating enough carbohydrates at meals.  Taking too much diabetes medicine.  Not timing your oral diabetes medicine or insulin doses with meals, snacks, and exercise.  Nausea and vomiting.  Certain medicines.  Severe illnesses, such as hepatitis, kidney disorders, and certain eating disorders.  Increased activity or exercise without eating something extra or adjusting medicines.  Drinking too much alcohol.  A nerve disorder that affects body functions like your heart rate, blood pressure, and digestion (autonomic neuropathy).  A condition where the stomach muscles do not function properly (gastroparesis). Therefore, medicines and food may not absorb properly.  Rarely, a tumor of the pancreas can produce too much insulin. SYMPTOMS   Hunger.  Sweating (diaphoresis).  Change in body temperature.  Shakiness.  Headache.  Anxiety.  Lightheadedness.  Irritability.  Difficulty concentrating.  Dry mouth.  Tingling or numbness in the hands or feet.  Restless sleep or sleep  disturbances.  Altered speech and coordination.  Change in mental status.  Seizures or prolonged convulsions.  Combativeness.  Drowsiness (lethargic).  Weakness.  Increased heart rate or palpitations.  Confusion.  Pale, gray skin color.  Blurred or double vision.  Fainting. DIAGNOSIS  A physical exam and medical history will be performed. Your caregiver may make a diagnosis based on your symptoms. Blood tests and other lab tests may be performed to confirm a diagnosis. Once the diagnosis is made, your caregiver will see if your signs and symptoms go away once your blood glucose is raised.  TREATMENT  Usually, you can easily treat your hypoglycemia when you notice symptoms.  Check your blood glucose. If it is less than 70 mg/dl, take one of the following:   3-4 glucose tablets.    cup juice.    cup regular soda.   1 cup skim milk.   -1 tube of glucose gel.   5-6 hard candies.   Avoid high-fat drinks or food that may delay a rise in blood glucose levels.  Do not take more than the recommended amount of sugary foods, drinks, gel, or tablets. Doing so will cause your blood glucose to go too high.   Wait 10-15 minutes and recheck your blood glucose. If it is still less than 70 mg/dl or below your target range, repeat treatment.   Eat a snack if it is more than 1 hour until your next meal.  There may be a time when your blood glucose may go so low that you are unable to treat yourself at home when you start to notice symptoms. You  may need someone to help you. You may even faint or be unable to swallow. If you cannot treat yourself, someone will need to bring you to the hospital.  [place]  If you have diabetes, follow your diabetes management plan by:  Taking your medicines as directed.  Following your exercise plan.  Following your meal plan. Do not skip meals. Eat on time.  Testing your blood glucose regularly. Check your blood  glucose before and after exercise. If you exercise longer or different than usual, be sure to check blood glucose more frequently.  Wearing your medical alert jewelry that says you have diabetes.  Identify the cause of your hypoglycemia. Then, develop ways to prevent the recurrence of hypoglycemia.  Do not take a hot bath or shower right after an insulin shot.  Always carry treatment with you. Glucose tablets are the easiest to carry.  If you are going to drink alcohol, drink it only with meals.  Tell friends or family members ways to keep you safe during a seizure. This may include removing hard or sharp objects from the area or turning you on your side.  Maintain a healthy weight. SEEK MEDICAL CARE IF:   You are having problems keeping your blood glucose in your target range.  You are having frequent episodes of hypoglycemia.  You feel you might be having side effects from your medicines.  You are not sure why your blood glucose is dropping so low.  You notice a change in vision or a new problem with your vision. SEEK IMMEDIATE MEDICAL CARE IF:   Confusion develops.  A change in mental status occurs.  The inability to swallow develops.  Fainting occurs. Document Released: 07/11/2005 Document Revised: 07/16/2013 Document Reviewed: 11/07/2011 [facility] Patient Information 2015 [place], [place]. This information is not intended to replace advice given to you by your health care provider. Make sure you discuss any questions you have with your health care provider.

## 2014-07-31 ENCOUNTER — Encounter: Payer: Self-pay | Admitting: Family Medicine

## 2014-09-02 ENCOUNTER — Other Ambulatory Visit: Payer: Self-pay | Admitting: Internal Medicine

## 2014-09-17 ENCOUNTER — Other Ambulatory Visit: Payer: Self-pay

## 2014-09-17 MED ORDER — SIMVASTATIN 80 MG PO TABS: 80.0000 mg | ORAL_TABLET | Freq: Every day | ORAL | Status: DC

## 2014-09-17 MED ORDER — METFORMIN HCL 500 MG PO TABS: ORAL_TABLET | ORAL | Status: DC

## 2014-09-17 MED ORDER — GLIMEPIRIDE 2 MG PO TABS: ORAL_TABLET | ORAL | Status: DC

## 2014-09-18 ENCOUNTER — Telehealth: Payer: Self-pay | Admitting: *Deleted

## 2014-09-18 MED ORDER — SIMVASTATIN 80 MG PO TABS: 80.0000 mg | ORAL_TABLET | Freq: Every day | ORAL | Status: DC

## 2014-09-18 NOTE — Telephone Encounter (Signed)
Medication refilled

## 2014-09-18 NOTE — Telephone Encounter (Signed)
simvastatin (ZOCOR) 80 MG tablet  Medication   Date: 09/17/2014  Department: [facility]: Marin Olp, MD      Order Providers    Prescribing Provider Encounter Provider   Marin Olp, MD Angela Adam, CMA    Medication Detail      Disp Refills Start End     simvastatin (ZOCOR) 80 MG tablet 90 tablet 1 09/17/2014     Sig - Route: Take 1 tablet (80 mg total) by mouth at bedtime. - Oral    E-Prescribing Status: Receipt confirmed by pharmacy (09/17/2014 12:48 PM EST)     Pharmacy    [place] [place] Encounter   Priority and Order Details

## 2014-10-06 LAB — HM DIABETES EYE EXAM

## 2014-10-07 ENCOUNTER — Encounter: Payer: Self-pay | Admitting: Family Medicine

## 2014-12-02 ENCOUNTER — Other Ambulatory Visit: Payer: Self-pay | Admitting: Family Medicine

## 2014-12-08 ENCOUNTER — Encounter: Payer: Self-pay | Admitting: Family Medicine

## 2014-12-08 ENCOUNTER — Ambulatory Visit (INDEPENDENT_AMBULATORY_CARE_PROVIDER_SITE_OTHER): Payer: 59 | Admitting: Family Medicine

## 2014-12-08 ENCOUNTER — Telehealth: Payer: Self-pay

## 2014-12-08 VITALS — BP 110/70 | HR 69 | Temp 98.0°F | Wt 231.0 lb

## 2014-12-08 DIAGNOSIS — E119 Type 2 diabetes mellitus without complications: Secondary | ICD-10-CM | POA: Diagnosis not present

## 2014-12-08 DIAGNOSIS — R945 Abnormal results of liver function studies: Secondary | ICD-10-CM

## 2014-12-08 DIAGNOSIS — R7989 Other specified abnormal findings of blood chemistry: Secondary | ICD-10-CM | POA: Diagnosis not present

## 2014-12-08 LAB — HEPATIC FUNCTION PANEL
ALBUMIN: 4.1 g/dL (ref 3.5–5.2)
ALT: 84 U/L — AB (ref 0–53)
AST: 58 U/L — ABNORMAL HIGH (ref 0–37)
Alkaline Phosphatase: 77 U/L (ref 39–117)
BILIRUBIN DIRECT: 0.1 mg/dL (ref 0.0–0.3)
TOTAL PROTEIN: 7.3 g/dL (ref 6.0–8.3)
Total Bilirubin: 0.5 mg/dL (ref 0.2–1.2)

## 2014-12-08 LAB — HEMOGLOBIN A1C: Hgb A1c MFr Bld: 7.4 % — ABNORMAL HIGH (ref 4.6–6.5)

## 2014-12-08 NOTE — Assessment & Plan Note (Signed)
Improving control with increase in amaryl to 2mg . We will work to get alc under 7 with lifestyle changes over next 4-5 months but consider amaryl 4mg  if does not meet goal or is not improving. Weight loss/exercise pivotal here and discussed

## 2014-12-08 NOTE — Patient Instructions (Signed)
Check a1c today  Congrats on retirement  If a1c <7.5, let's see each other 4-5 months with goal weight loss 5 lbs  If a1c >7.5, lets still work on 5 lbs goal but see each other in 3 months  You set a goal to walk 20 minutes 6 days a week

## 2014-12-08 NOTE — Assessment & Plan Note (Addendum)
Minor elevations worsening. Asked patient to cut alcohol or at least cut back down to 4 a week. Also needs exercise. Suspect fatty liver and alcohol related. If still trending up at follow up, may need to stop simvastatin and/or further investigate

## 2014-12-08 NOTE — Progress Notes (Signed)
[name], MD  Subjective:  Juan Bennett is a 59 y.o. year old very pleasant male patient who presents with:  DIABETES Type II-improving but mild poor control  Lab Results  Component Value Date   HGBA1C 7.4* 12/08/2014   HGBA1C 7.9* 07/30/2014   HGBA1C 6.9* 02/20/2014  Wt increase and sedentary at last visit. Increased amaryl to 2mg .  Wt stable this visit. 5 lbs weight loss Medications taking and tolerating-also takes metformin 2g daily Blood Sugars per patient-does not check but has meter Regular Exercise-discussed need for this, Trying to walk 15-20 minutes but misses several dyas On Aspirin-yes On statin-yes Daily foot monitoring-yes  ROS- Denies Polyuria,Polydipsia, nocturia, Vision changes, feet or hand numbness/pain/tingling. Denies  Hypoglycemia symptoms (shaky, sweaty, hungry, weak anxious, tremor, palpitations, confusion, behavior change).   Elevated LFTs -minor elevations last visit. States his alcohol consumption has been higher closer to 14 a week from previously 4 over a weekend.  ROS- denies RUQ pain, jaundice  Past Medical History- goiter, HLD  Medications- reviewed and updated Current Outpatient Prescriptions  Medication Sig Dispense Refill  . aspirin 325 MG tablet Take 325 mg by mouth daily.      Marland Kitchen glimepiride (AMARYL) 2 MG tablet TAKE 1 TABLET DAILY BEFORE BREAKFAST. 90 tablet 3  . metFORMIN (GLUCOPHAGE) 500 MG tablet TAKE 2 TABLETS BY MOUTH TWO TIMES A DAY WITH MEALS 480 tablet 5  . simvastatin (ZOCOR) 40 MG tablet Take 40 mg by mouth daily.     No current facility-administered medications for this visit.    Objective: BP 110/70 mmHg  Pulse 69  Temp(Src) 98 F (36.7 C)  Wt 231 lb (104.781 kg) Gen: NAD, resting comfortably CV: RRR no murmurs rubs or gallops Lungs: CTAB no crackles, wheeze, rhonchi Abdomen: soft/nontender/nondistended/normal bowel sounds. No rebound or guarding. Obese.  Ext: no edema Skin: warm, dry, no rash  Neuro: grossly  normal, moves all extremities, normal gait   Assessment/Plan:  Diabetes mellitus type II, controlled Improving control with increase in amaryl to 2mg . We will work to get alc under 7 with lifestyle changes over next 4-5 months but consider amaryl 4mg  if does not meet goal or is not improving. Weight loss/exercise pivotal here and discussed   Elevated LFTs Minor elevations worsening. Asked patient to cut alcohol or at least cut back down to 4 a week. Also needs exercise. Suspect fatty liver and alcohol related. If still trending up at follow up, may need to stop simvastatin and/or further investigate    Results for orders placed or performed in visit on 12/08/14 (from the past 24 hour(s))  Hepatic Function Panel     Status: Abnormal   Collection Time: 12/08/14  9:39 AM  Result Value Ref Range   Total Bilirubin 0.5 0.2 - 1.2 mg/dL   Bilirubin, Direct 0.1 0.0 - 0.3 mg/dL   Alkaline Phosphatase 77 39 - 117 U/L   AST 58 (H) 0 - 37 U/L   ALT 84 (H) 0 - 53 U/L   Total Protein 7.3 6.0 - 8.3 g/dL   Albumin 4.1 3.5 - 5.2 g/dL  Hemoglobin A1c     Status: Abnormal   Collection Time: 12/08/14  9:39 AM  Result Value Ref Range   Hgb A1c MFr Bld 7.4 (H) 4.6 - 6.5 %

## 2014-12-08 NOTE — Telephone Encounter (Signed)
Medication changed

## 2014-12-23 ENCOUNTER — Encounter: Payer: Self-pay | Admitting: Internal Medicine

## 2015-04-13 ENCOUNTER — Telehealth: Payer: Self-pay | Admitting: Family Medicine

## 2015-04-13 NOTE — Telephone Encounter (Signed)
I called pt on both cell & home number, no answer. Pt scheduled an appointment through my chart on 04/28/2015. 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 Dr. Sarajane Jews is advising pt to follow up with Dr. Yong Channel. Can you check into this?

## 2015-04-23 ENCOUNTER — Encounter: Payer: Self-pay | Admitting: Family Medicine

## 2015-04-23 ENCOUNTER — Ambulatory Visit (INDEPENDENT_AMBULATORY_CARE_PROVIDER_SITE_OTHER): Payer: 59 | Admitting: Family Medicine

## 2015-04-23 VITALS — BP 124/80 | HR 80 | Temp 98.2°F | Wt 230.0 lb

## 2015-04-23 DIAGNOSIS — Z23 Encounter for immunization: Secondary | ICD-10-CM | POA: Diagnosis not present

## 2015-04-23 DIAGNOSIS — E119 Type 2 diabetes mellitus without complications: Secondary | ICD-10-CM | POA: Diagnosis not present

## 2015-04-23 DIAGNOSIS — R7989 Other specified abnormal findings of blood chemistry: Secondary | ICD-10-CM

## 2015-04-23 DIAGNOSIS — E785 Hyperlipidemia, unspecified: Secondary | ICD-10-CM

## 2015-04-23 DIAGNOSIS — R945 Abnormal results of liver function studies: Secondary | ICD-10-CM

## 2015-04-23 LAB — COMPREHENSIVE METABOLIC PANEL
ALK PHOS: 74 U/L (ref 39–117)
ALT: 202 U/L — AB (ref 0–53)
AST: 111 U/L — AB (ref 0–37)
Albumin: 4.2 g/dL (ref 3.5–5.2)
BILIRUBIN TOTAL: 0.6 mg/dL (ref 0.2–1.2)
BUN: 11 mg/dL (ref 6–23)
CO2: 23 meq/L (ref 19–32)
CREATININE: 0.85 mg/dL (ref 0.40–1.50)
Calcium: 9.4 mg/dL (ref 8.4–10.5)
Chloride: 104 mEq/L (ref 96–112)
GFR: 98.39 mL/min (ref >60.00)
GLUCOSE: 184 mg/dL — AB (ref 70–99)
Potassium: 4 mEq/L (ref 3.5–5.1)
Sodium: 137 mEq/L (ref 135–145)
TOTAL PROTEIN: 7.5 g/dL (ref 6.0–8.3)

## 2015-04-23 LAB — HEMOGLOBIN A1C: HEMOGLOBIN A1C: 8.3 % — AB (ref 4.6–6.5)

## 2015-04-23 LAB — LDL CHOLESTEROL, DIRECT: LDL DIRECT: 148 mg/dL

## 2015-04-23 NOTE — Assessment & Plan Note (Signed)
S: mild poor control on simvastatin 80mg  and also some concern about LFT elevation A/P: check LDL, consider holding statin if LFTs trending up even if LDL up

## 2015-04-23 NOTE — Assessment & Plan Note (Addendum)
S: AST and ALT trending up last visit but <100. Alcohol about 14 drinks at that time- has cut down some weeks but otherwise has not. Walking 2-3 days a week A/P: Update LFTs today- if continues to trend up hold statin. Will also check Hep B, Hep A, hep C status. Consider immunization if not immunized.  Consider ultrasound  Addendum: Lab Results  Component Value Date   ALT 202* 04/23/2015   AST 111* 04/23/2015   ALKPHOS 74 04/23/2015   BILITOT 0.6 04/23/2015  LFTs trending up- will stop simvastatin at this time and obtain RUQ ultrasound

## 2015-04-23 NOTE — Addendum Note (Signed)
Addended by: Marin Olp on: 04/23/2015 12:54 PM   Modules accepted: Orders

## 2015-04-23 NOTE — Patient Instructions (Addendum)
Flu shot today.  Labs before you go  Continue efforts for weight loss- healthy eating and regular walking  Let's follow up 4-6 months depending on A1c- schedule a physical at that time

## 2015-04-23 NOTE — Progress Notes (Addendum)
[name], MD  Subjective:  Juan Bennett is a 59 y.o. year old very pleasant male patient who presents for/with See problem oriented charting ROS- no chest pain or shortness of breath. No ruq pain. No hypoglycemia or blurry vision or headache  Past Medical History-  Patient Active Problem List   Diagnosis Date Noted  . Diabetes mellitus type II, controlled 09/04/2007    Priority: High  . Goiter 10/15/2009    Priority: Medium  . Hyperlipidemia 09/04/2007    Priority: Medium  . Jock itch 07/30/2014    Priority: Low  . ALLERGIC RHINITIS 09/04/2007    Priority: Low  . Elevated LFTs 12/08/2014   Medications- reviewed and updated Current Outpatient Prescriptions  Medication Sig Dispense Refill  . aspirin 325 MG tablet Take 325 mg by mouth daily.      Marland Kitchen glimepiride (AMARYL) 2 MG tablet TAKE 1 TABLET DAILY BEFORE BREAKFAST. 90 tablet 3  . metFORMIN (GLUCOPHAGE) 500 MG tablet TAKE 2 TABLETS BY MOUTH TWO TIMES A DAY WITH MEALS 480 tablet 5  . simvastatin (ZOCOR) 40 MG tablet Take 40 mg by mouth daily.     No current facility-administered medications for this visit.    Objective: BP 124/80 mmHg  Pulse 80  Temp(Src) 98.2 F (36.8 C)  Wt 230 lb (104.327 kg) Gen: NAD, resting comfortably CV: RRR no murmurs rubs or gallops Lungs: CTAB no crackles, wheeze, rhonchi Abdomen: soft/nontender/nondistended/normal bowel sounds. No rebound or guarding.  Ext: no edema Skin: warm, dry Neuro: grossly normal, moves all extremities Diabetic Foot Exam - Simple   Simple Foot Form  Diabetic Foot exam was performed with the following findings:  Yes 04/23/2015  9:50 AM  Visual Inspection  No deformities, no ulcerations, no other skin breakdown bilaterally:  Yes  Sensation Testing  Intact to touch and monofilament testing bilaterally:  Yes  Pulse Check  Posterior Tibialis and Dorsalis pulse intact bilaterally:  Yes  Comments     Assessment/Plan:  Diabetes mellitus type II,  controlled S: compliant with Metformin 1g BID, amaryl 2mg . Weight down 1 lb. Mild poor control Lab Results  Component Value Date   HGBA1C 7.4* 12/08/2014  A/P: Hope[place] a1c <7- if not, consider increase to amaryl 4mg .   Elevated LFTs S: AST and ALT trending up last visit but <100. Alcohol about 14 drinks at that time- has cut down some weeks but otherwise has not. Walking 2-3 days a week A/P: Update LFTs today- if continues to trend up hold statin. Will also check Hep B, Hep A, hep C status. Consider immunization if not immunized.  Consider ultrasound  Addendum: Lab Results  Component Value Date   ALT 202* 04/23/2015   AST 111* 04/23/2015   ALKPHOS 74 04/23/2015   BILITOT 0.6 04/23/2015  LFTs trending up- will stop simvastatin at this time and obtain RUQ ultrasound   Hyperlipidemia S: mild poor control on simvastatin 80mg  and also some concern about LFT elevation A/P: check LDL, consider holding statin if LFTs trending up even if LDL up      likely 4-6 months  nonfasting Orders Placed This Encounter  Procedures  . Flu Vaccine QUAD 36+ mos IM  . Hemoglobin A1c    [place]  . LDL cholesterol, direct    [place]  . Comprehensive metabolic panel    [place]  . Hepatitis C antibody, reflex    [facility]  . Hep B Surface Antigen  . Hepatitis B core antibody, IgM  . Hepatitis B surface antibody  .  Hep A Antibody, Total

## 2015-04-23 NOTE — Assessment & Plan Note (Signed)
S: compliant with Metformin 1g BID, amaryl 2mg . Weight down 1 lb. Mild poor control Lab Results  Component Value Date   HGBA1C 7.4* 12/08/2014  A/P: Hopeful a1c <7- if not, consider increase to amaryl 4mg .

## 2015-04-24 LAB — HEPATITIS C ANTIBODY: HCV AB: NEGATIVE

## 2015-04-24 LAB — HEPATITIS B SURFACE ANTIBODY, QUANTITATIVE: Hepatitis B-Post: 0 m[IU]/mL

## 2015-04-24 LAB — HEPATITIS A ANTIBODY, TOTAL: Hep A Total Ab: NONREACTIVE

## 2015-04-24 LAB — HEPATITIS B SURFACE ANTIGEN: Hepatitis B Surface Ag: NEGATIVE

## 2015-04-24 LAB — HEPATITIS B CORE ANTIBODY, IGM: HEP B C IGM: NONREACTIVE

## 2015-04-28 ENCOUNTER — Ambulatory Visit: Payer: Self-pay | Admitting: Family Medicine

## 2015-04-28 ENCOUNTER — Ambulatory Visit: Payer: 59 | Admitting: Family Medicine

## 2015-05-01 ENCOUNTER — Ambulatory Visit
Admission: RE | Admit: 2015-05-01 | Discharge: 2015-05-01 | Disposition: A | Discharge status: Home / Self Care | Payer: 59 | Source: Ambulatory Visit | Attending: Family Medicine | Admitting: Family Medicine

## 2015-05-01 ENCOUNTER — Other Ambulatory Visit: Payer: Self-pay | Admitting: Family Medicine

## 2015-05-01 DIAGNOSIS — R7989 Other specified abnormal findings of blood chemistry: Secondary | ICD-10-CM

## 2015-05-01 DIAGNOSIS — R945 Abnormal results of liver function studies: Principal | ICD-10-CM

## 2015-05-04 ENCOUNTER — Other Ambulatory Visit: Payer: 59

## 2015-05-12 ENCOUNTER — Other Ambulatory Visit (INDEPENDENT_AMBULATORY_CARE_PROVIDER_SITE_OTHER): Payer: 59

## 2015-05-12 DIAGNOSIS — R7989 Other specified abnormal findings of blood chemistry: Secondary | ICD-10-CM | POA: Diagnosis not present

## 2015-05-12 DIAGNOSIS — R945 Abnormal results of liver function studies: Principal | ICD-10-CM

## 2015-05-12 LAB — HEPATIC FUNCTION PANEL
ALBUMIN: 4.1 g/dL (ref 3.5–5.2)
ALT: 120 U/L — ABNORMAL HIGH (ref 0–53)
AST: 52 U/L — ABNORMAL HIGH (ref 0–37)
Alkaline Phosphatase: 72 U/L (ref 39–117)
Bilirubin, Direct: 0.1 mg/dL (ref 0.0–0.3)
TOTAL PROTEIN: 6.9 g/dL (ref 6.0–8.3)
Total Bilirubin: 0.6 mg/dL (ref 0.2–1.2)

## 2015-05-13 ENCOUNTER — Other Ambulatory Visit: Payer: Self-pay | Admitting: Family Medicine

## 2015-05-13 DIAGNOSIS — R945 Abnormal results of liver function studies: Principal | ICD-10-CM

## 2015-05-13 DIAGNOSIS — R7989 Other specified abnormal findings of blood chemistry: Secondary | ICD-10-CM

## 2015-06-17 ENCOUNTER — Encounter: Payer: Self-pay | Admitting: Family Medicine

## 2015-06-17 ENCOUNTER — Telehealth: Payer: Self-pay | Admitting: Family Medicine

## 2015-06-17 NOTE — Telephone Encounter (Signed)
Keba please call. These were not screening tests.   He had elevated transaminases/LFTs Lab Results  Component Value Date   ALT 120* 05/12/2015   AST 52* 05/12/2015   ALKPHOS 72 05/12/2015   BILITOT 0.6 05/12/2015   We are trying to uncover underlying reason for this

## 2015-06-17 NOTE — Telephone Encounter (Signed)
Juan Bennett from [facility] call to say that [facility] need documentation to suppot why pt need the following screening for hep a and hep b. The test were denied until they receive medical necessities    [phone]

## 2015-06-23 NOTE — Telephone Encounter (Signed)
Perfect thanks

## 2015-06-23 NOTE — Telephone Encounter (Signed)
FYI: I called [employer] and they needed the Last OV note faxed to [facility] with documentation of elevated LFTs. Faxed over copy of OV note from 04/23/15.

## 2015-06-29 ENCOUNTER — Other Ambulatory Visit (INDEPENDENT_AMBULATORY_CARE_PROVIDER_SITE_OTHER): Payer: 59

## 2015-06-29 DIAGNOSIS — R7989 Other specified abnormal findings of blood chemistry: Secondary | ICD-10-CM | POA: Diagnosis not present

## 2015-06-29 DIAGNOSIS — R945 Abnormal results of liver function studies: Principal | ICD-10-CM

## 2015-06-29 LAB — HEPATIC FUNCTION PANEL
ALT: 116 U/L — ABNORMAL HIGH (ref 0–53)
AST: 52 U/L — ABNORMAL HIGH (ref 0–37)
Albumin: 4 g/dL (ref 3.5–5.2)
Alkaline Phosphatase: 72 U/L (ref 39–117)
BILIRUBIN TOTAL: 0.6 mg/dL (ref 0.2–1.2)
Bilirubin, Direct: 0.1 mg/dL (ref 0.0–0.3)
Total Protein: 7.3 g/dL (ref 6.0–8.3)

## 2015-07-17 ENCOUNTER — Ambulatory Visit (INDEPENDENT_AMBULATORY_CARE_PROVIDER_SITE_OTHER): Payer: 59 | Admitting: Family Medicine

## 2015-07-17 ENCOUNTER — Encounter: Payer: Self-pay | Admitting: Family Medicine

## 2015-07-17 VITALS — BP 138/70 | HR 74 | Temp 98.0°F | Wt 230.0 lb

## 2015-07-17 DIAGNOSIS — R7989 Other specified abnormal findings of blood chemistry: Secondary | ICD-10-CM | POA: Diagnosis not present

## 2015-07-17 DIAGNOSIS — C4491 Basal cell carcinoma of skin, unspecified: Secondary | ICD-10-CM

## 2015-07-17 DIAGNOSIS — R945 Abnormal results of liver function studies: Secondary | ICD-10-CM

## 2015-07-17 DIAGNOSIS — Z23 Encounter for immunization: Secondary | ICD-10-CM

## 2015-07-17 LAB — IRON AND TIBC
%SAT: 44 % (ref 15–60)
IRON: 161 ug/dL (ref 50–180)
TIBC: 363 ug/dL (ref 250–425)
UIBC: 202 ug/dL (ref 125–400)

## 2015-07-17 LAB — T3, FREE: T3, Free: 3.9 pg/mL (ref 2.3–4.2)

## 2015-07-17 LAB — T4, FREE: Free T4: 0.87 ng/dL (ref 0.60–1.60)

## 2015-07-17 LAB — TSH: TSH: 2.07 u[IU]/mL (ref 0.35–4.50)

## 2015-07-17 LAB — FERRITIN: Ferritin: 72.4 ng/mL (ref 22.0–322.0)

## 2015-07-17 NOTE — Assessment & Plan Note (Addendum)
S: Hepatitis testing negative. U/s without fatty liver. AST and ALT remain elevated but improved after stopping statin. 2-4 alcoholic beverages a week A/P: will screen for hemochromatosis (FE/TIBC >45% worrisome). Check SPEP for autoimmune hepatitis and consider ANA and ASMA if positive, Check TSH, free T4 and T3. amaryl 2% chance of affecting but doubt as cause and no other meds should cause. No herbals or recreational drugs. Doubt celiac disease with no diarrhea or history iron deficiency. Return in 6 months-consider testing for wilson disease with serum ceruloplasmin, alpha 1 antitripsin but doubt with no pulmonary issues, 8 am serum cortisol and plasma ACTH to look for adrenal insufficiency. Consider CK and aldolase. Last step would be biopsy but would likely reserve if hold near 2x ULN or less.  twinrix 0,1,6 months started 07/17/15. Return in 1 month for 2nd then see me in 6 months for 3rd.   Per avs: In general, healthy eating and regular exercise are good for the liver so would like to see your weight trend back towards the 210-220 range you were in 2013 by follow up

## 2015-07-17 NOTE — Patient Instructions (Signed)
Labs today Twinrix today (Hep A and Hep B combined immunization)  Return in 1 month for repeat Twinrix- schedule injection with [facility] at checkout  We will call you within a week about your referral to dermatology. If you do not hear within 2 weeks, give [place] a call.   Schedule at checkout a 6 month physical and come in for labs a week prior. We will do the final twinrix immunization at that time.   In general, healthy eating and regular exercise are good for the liver so would like to see your weight trend back towards the 210-220 range you were in 2013 by follow up

## 2015-07-17 NOTE — Progress Notes (Signed)
[name], MD  Subjective:  Juan Bennett is a 59 y.o. year old very pleasant male patient who presents for/with See problem oriented charting ROS- no RUQ pain, no pruritis no chest pain or shortness of breath  Past Medical History-  Patient Active Problem List   Diagnosis Date Noted  . Diabetes mellitus type II, controlled ([place]) 09/04/2007    Priority: High  . Elevated LFTs 12/08/2014    Priority: Medium  . Goiter 10/15/2009    Priority: Medium  . Hyperlipidemia 09/04/2007    Priority: Medium  . Jock itch 07/30/2014    Priority: Low  . ALLERGIC RHINITIS 09/04/2007    Priority: Low    Medications- reviewed and updated Current Outpatient Prescriptions  Medication Sig Dispense Refill  . aspirin 325 MG tablet Take 325 mg by mouth daily.      Marland Kitchen glimepiride (AMARYL) 2 MG tablet TAKE 1 TABLET DAILY BEFORE BREAKFAST. 90 tablet 3  . metFORMIN (GLUCOPHAGE) 500 MG tablet TAKE 2 TABLETS BY MOUTH TWO TIMES A DAY WITH MEALS 480 tablet 5   No current facility-administered medications for this visit.    Objective: BP 138/70 mmHg  Pulse 74  Temp(Src) 98 F (36.7 C)  Wt 230 lb (104.327 kg) Gen: NAD, resting comfortably CV: RRR no murmurs rubs or gallops Lungs: CTAB no crackles, wheeze, rhonchi Abdomen: soft/nontender/nondistended/normal bowel sounds. No rebound or guarding.  Ext: no edema Skin: warm, dry Left cheek with pearly lesion about 5 mm in diameter with no central ulceration Neuro: grossly normal, moves all extremities  Assessment/Plan:  Elevated LFTs S: Hepatitis testing negative. U/s without fatty liver. AST and ALT remain elevated but improved after stopping statin. 2-4 alcoholic beverages a week A/P: will screen for hemochromatosis (FE/TIBC >45% worrisome). Check SPEP for autoimmune hepatitis and consider ANA and ASMA if positive, Check TSH, free T4 and T3. amaryl 2% chance of affecting but doubt as cause and no other meds should cause. No herbals or  recreational drugs. Doubt celiac disease with no diarrhea or history iron deficiency. Return in 6 months-consider testing for wilson disease with serum ceruloplasmin, alpha 1 antitripsin but doubt with no pulmonary issues, 8 am serum cortisol and plasma ACTH to look for adrenal insufficiency. Consider CK and aldolase. Last step would be biopsy but would likely reserve if hold near 2x ULN or less.  twinrix 0,1,6 months started 07/17/15. Return in 1 month for 2nd then see me in 6 months for 3rd.   Per avs: In general, healthy eating and regular exercise are good for the liver so would like to see your weight trend back towards the 210-220 range you were in 2013 by follow up  Basal cell carcinoma of skin  S: new pearly lesion on left cheek for 3 weeks seems to be slowly growing A/P: concern for basal cell on left cheek  Plan: Ambulatory referral to Dermatology  Schedule at checkout a 6 month physical and come in for labs a week prior. We will do the final twinrix immunization at that time.  Return precautions advised.   Orders Placed This Encounter  Procedures  . Hepatitis A hepatitis B combined vaccine IM  . Ferritin  . Iron and TIBC  . Protein Electrophoresis, Serum  . TSH    [place]  . T4, free    [place]  . T3, free  . Ambulatory referral to Dermatology    Referral Priority:  Routine    Referral Type:  Consultation    Referral Reason:  Specialty Services Required    Requested Specialty:  Dermatology    Number of Visits Requested:  1

## 2015-07-19 ENCOUNTER — Encounter: Payer: Self-pay | Admitting: Family Medicine

## 2015-07-22 LAB — PROTEIN ELECTROPHORESIS, SERUM
ALBUMIN ELP: 4.4 g/dL (ref 3.8–4.8)
ALPHA-1-GLOBULIN: 0.3 g/dL (ref 0.2–0.3)
Alpha-2-Globulin: 0.7 g/dL (ref 0.5–0.9)
BETA 2: 0.4 g/dL (ref 0.2–0.5)
BETA GLOBULIN: 0.5 g/dL (ref 0.4–0.6)
Gamma Globulin: 1 g/dL (ref 0.8–1.7)
Total Protein, Serum Electrophoresis: 7.2 g/dL (ref 6.1–8.1)

## 2015-08-17 ENCOUNTER — Ambulatory Visit (INDEPENDENT_AMBULATORY_CARE_PROVIDER_SITE_OTHER): Payer: 59 | Admitting: Family Medicine

## 2015-08-17 DIAGNOSIS — Z23 Encounter for immunization: Secondary | ICD-10-CM | POA: Diagnosis not present

## 2015-10-20 ENCOUNTER — Encounter: Payer: 59 | Admitting: Family Medicine

## 2015-11-02 LAB — HM DIABETES EYE EXAM

## 2015-11-03 ENCOUNTER — Encounter: Payer: Self-pay | Admitting: Family Medicine

## 2015-11-29 ENCOUNTER — Other Ambulatory Visit: Payer: Self-pay | Admitting: Family Medicine

## 2016-01-11 ENCOUNTER — Other Ambulatory Visit (INDEPENDENT_AMBULATORY_CARE_PROVIDER_SITE_OTHER): Payer: 59

## 2016-01-11 DIAGNOSIS — Z0000 Encounter for general adult medical examination without abnormal findings: Secondary | ICD-10-CM | POA: Diagnosis not present

## 2016-01-11 DIAGNOSIS — E785 Hyperlipidemia, unspecified: Secondary | ICD-10-CM

## 2016-01-11 DIAGNOSIS — E119 Type 2 diabetes mellitus without complications: Secondary | ICD-10-CM | POA: Diagnosis not present

## 2016-01-11 LAB — LDL CHOLESTEROL, DIRECT: LDL DIRECT: 223 mg/dL

## 2016-01-11 LAB — CBC WITH DIFFERENTIAL/PLATELET
BASOS ABS: 0 10*3/uL (ref 0.0–0.1)
Basophils Relative: 0.8 % (ref 0.0–3.0)
Eosinophils Absolute: 0.2 10*3/uL (ref 0.0–0.7)
Eosinophils Relative: 2.7 % (ref 0.0–5.0)
HCT: 45.7 % (ref 39.0–52.0)
Hemoglobin: 15.7 g/dL (ref 13.0–17.0)
LYMPHS ABS: 2 10*3/uL (ref 0.7–4.0)
Lymphocytes Relative: 34.4 % (ref 12.0–46.0)
MCHC: 34.4 g/dL (ref 30.0–36.0)
MCV: 86.2 fl (ref 78.0–100.0)
MONO ABS: 0.5 10*3/uL (ref 0.1–1.0)
Monocytes Relative: 9 % (ref 3.0–12.0)
NEUTROS ABS: 3.1 10*3/uL (ref 1.4–7.7)
NEUTROS PCT: 53.1 % (ref 43.0–77.0)
PLATELETS: 187 10*3/uL (ref 150.0–400.0)
RBC: 5.29 Mil/uL (ref 4.22–5.81)
RDW: 13.2 % (ref 11.5–15.5)
WBC: 5.8 10*3/uL (ref 4.0–10.5)

## 2016-01-11 LAB — BASIC METABOLIC PANEL
BUN: 8 mg/dL (ref 6–23)
CALCIUM: 9.2 mg/dL (ref 8.4–10.5)
CO2: 22 mEq/L (ref 19–32)
Chloride: 104 mEq/L (ref 96–112)
Creatinine, Ser: 0.89 mg/dL (ref 0.40–1.50)
GFR: 93.07 mL/min (ref >60.00)
GLUCOSE: 185 mg/dL — AB (ref 70–99)
Potassium: 3.9 mEq/L (ref 3.5–5.1)
Sodium: 137 mEq/L (ref 135–145)

## 2016-01-11 LAB — LIPID PANEL
CHOL/HDL RATIO: 6
CHOLESTEROL: 299 mg/dL — AB (ref 0–200)
HDL: 46.2 mg/dL (ref >39.00)
NonHDL: 252.65
Triglycerides: 217 mg/dL — ABNORMAL HIGH (ref 0.0–149.0)
VLDL: 43.4 mg/dL — AB (ref 0.0–40.0)

## 2016-01-11 LAB — HEPATIC FUNCTION PANEL
ALBUMIN: 4.1 g/dL (ref 3.5–5.2)
ALT: 69 U/L — ABNORMAL HIGH (ref 0–53)
AST: 34 U/L (ref 0–37)
Alkaline Phosphatase: 80 U/L (ref 39–117)
BILIRUBIN TOTAL: 0.5 mg/dL (ref 0.2–1.2)
Bilirubin, Direct: 0.1 mg/dL (ref 0.0–0.3)
Total Protein: 7.1 g/dL (ref 6.0–8.3)

## 2016-01-11 LAB — POC URINALSYSI DIPSTICK (AUTOMATED)
BILIRUBIN UA: NEGATIVE
Blood, UA: NEGATIVE
KETONES UA: NEGATIVE
LEUKOCYTES UA: NEGATIVE
NITRITE UA: NEGATIVE
Protein, UA: NEGATIVE
Spec Grav, UA: 1.015
Urobilinogen, UA: 0.2
pH, UA: 5.5

## 2016-01-11 LAB — TSH: TSH: 3 u[IU]/mL (ref 0.35–4.50)

## 2016-01-11 LAB — MICROALBUMIN / CREATININE URINE RATIO
CREATININE, U: 110.6 mg/dL
MICROALB/CREAT RATIO: 0.6 mg/g (ref 0.0–30.0)
Microalb, Ur: <0.7 mg/dL (ref 0.0–1.9)

## 2016-01-11 LAB — PSA: PSA: 0.55 ng/mL (ref 0.10–4.00)

## 2016-01-11 LAB — HEMOGLOBIN A1C: Hgb A1c MFr Bld: 9 % — ABNORMAL HIGH (ref 4.6–6.5)

## 2016-01-15 ENCOUNTER — Ambulatory Visit (INDEPENDENT_AMBULATORY_CARE_PROVIDER_SITE_OTHER): Payer: 59 | Admitting: Family Medicine

## 2016-01-15 ENCOUNTER — Encounter: Payer: Self-pay | Admitting: Family Medicine

## 2016-01-15 VITALS — BP 148/86 | HR 76 | Temp 97.8°F | Ht 72.25 in | Wt 221.0 lb

## 2016-01-15 DIAGNOSIS — R6889 Other general symptoms and signs: Secondary | ICD-10-CM

## 2016-01-15 DIAGNOSIS — Z23 Encounter for immunization: Secondary | ICD-10-CM | POA: Diagnosis not present

## 2016-01-15 DIAGNOSIS — E119 Type 2 diabetes mellitus without complications: Secondary | ICD-10-CM | POA: Diagnosis not present

## 2016-01-15 DIAGNOSIS — Z0001 Encounter for general adult medical examination with abnormal findings: Secondary | ICD-10-CM | POA: Diagnosis not present

## 2016-01-15 MED ORDER — ONETOUCH DELICA LANCETS FINE MISC: Status: DC

## 2016-01-15 MED ORDER — GLUCOSE BLOOD VI STRP: ORAL_STRIP | Status: DC

## 2016-01-15 MED ORDER — METFORMIN HCL 1000 MG PO TABS: 1000.0000 mg | ORAL_TABLET | Freq: Two times a day (BID) | ORAL | Status: DC

## 2016-01-15 MED ORDER — GLIMEPIRIDE 4 MG PO TABS: 4.0000 mg | ORAL_TABLET | Freq: Every day | ORAL | Status: DC

## 2016-01-15 NOTE — Patient Instructions (Addendum)
Increase glimepiride to 4mg  each morning  Increase metformin to 1000mg  twice a day  Continue weight loss efforts. Add walking 30 minutes daily as additional medicine for your diabetes.   Liver function has improved off cholesterol medicine- lets remain off for now and then reevalute liver and diabetes in 3 months. Hopeful we can get a1c at least below 8 with above changes  Twinrix final vaccine before you leave

## 2016-01-15 NOTE — Assessment & Plan Note (Signed)
LFTs improving off statin- would love for him to be back on but holding off for now. Previous AST over 200 now normalized. If ALT normalizes- may trial atorvastatin 10mg  with clos emonitoring of LFTs

## 2016-01-15 NOTE — Progress Notes (Signed)
Phone: [phone]  Subjective:  Patient presents today for their annual physical. Chief complaint-noted.   See problem oriented charting- ROS- full  review of systems was completed and negative including   The following were reviewed and entered/updated in epic: Past Medical History  Diagnosis Date  . Allergy   . Hyperlipidemia   . Diabetes mellitus    Patient Active Problem List   Diagnosis Date Noted  . Diabetes mellitus type II, controlled ([place]) 09/04/2007    Priority: High  . Elevated LFTs 12/08/2014    Priority: Medium  . Goiter 10/15/2009    Priority: Medium  . Hyperlipidemia 09/04/2007    Priority: Medium  . Jock itch 07/30/2014    Priority: Low  . ALLERGIC RHINITIS 09/04/2007    Priority: Low   No past surgical history on file.  Family History  Problem Relation Age of Onset  . Diabetes Mother   . Deep vein thrombosis Father   . Diabetes Paternal Uncle     Medications- reviewed and updated Current Outpatient Prescriptions  Medication Sig Dispense Refill  . aspirin 325 MG tablet Take 325 mg by mouth daily.      Marland Kitchen glimepiride (AMARYL) 2 MG tablet TAKE 1 TABLET BY MOUTH  DAILY BEFORE BREAKFAST. 90 tablet 3  . metFORMIN (GLUCOPHAGE) 500 MG tablet TAKE 2 TABLETS BY MOUTH TWO TIMES A DAY WITH MEALS 480 tablet 5   No current facility-administered medications for this visit.    Allergies-reviewed and updated No Known Allergies  Social History   Social History  . Marital Status: Married    Spouse Name: N/A  . Number of Children: N/A  . Years of Education: N/A   Social History Main Topics  . Smoking status: Former Smoker    Types: Cigarettes, Cigars  . Smokeless tobacco: Never Used     Comment: occasional cigars 6-8 per year. quit many years ago.   . Alcohol Use: 0.0 oz/week    0 Standard drinks or equivalent per week     Comment: weekends. 4-6 mixed drinks over the full weekend  . Drug Use: No  . Sexual Activity: Not Asked   Other Topics  Concern  . None   Social History Narrative   Married and has rising senior 01/2014 daughter. Married since 84. Retired age 80.    Work at [employer] long time.       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 Oropharynx normal Neck: mild thyromegaly CV: RRR no murmurs rubs or gallops Lungs: CTAB no crackles, wheeze, rhonchi Abdomen: soft/nontender/nondistended/normal bowel sounds. No rebound or guarding.  Rectal: normal tone, normal size prostate, no masses or tenderness Ext: no edema Skin: warm, dry Neuro: grossly normal, moves all extremities, PERRLA  Assessment/Plan:  60 y.o. male presenting for annual physical.  Health Maintenance counseling: 1. Anticipatory guidance: Patient counseled regarding regular dental exams, eye exams, wearing seatbelts.  2. Risk factor reduction:  Advised patient of need for regular exercise and diet rich and fruits and vegetables to reduce risk of heart attack and stroke.  3. Immunizations/screenings/ancillary studies- needs final twinrix -given today  Immunization History  Administered Date(s) Administered  . Hep A / Hep B 07/17/2015, 08/17/2015  .  Influenza Whole 05/25/2009  . Influenza,inj,Quad PF,36+ Mos 04/23/2015  . Influenza-Unspecified 06/08/2014  . Pneumococcal Conjugate-13 09/29/2008  . Pneumococcal Polysaccharide-23 09/29/2008, 02/20/2014  . Td 08/25/2006  4. Prostate cancer screening- low risk based off rectal and psa trend  Lab Results  Component Value Date   PSA 0.55 01/11/2016   PSA 0.50 02/08/2013   PSA 0.48 11/11/2010   5. Colon cancer screening - 10/26/2007 with 10 year repeat 6. Skin cancer screening- referred to dermatology 06/2015 for possible  Status of  chronic or acute concerns   HLD-poor control but with LFT elevation will not restart at this time as improving off statin Lab Results  Component Value Date   CHOL 299* 01/11/2016   HDL 46.20 01/11/2016   LDLCALC 116* 07/30/2014   LDLDIRECT 223.0 01/11/2016   TRIG 217.0* 01/11/2016   CHOLHDL 6 01/11/2016   TSH normal despite goiter history unchanged Diabetes mellitus type II, controlled ([place]) S: poor control on Metformin 500mg  BID, amaryl 2mg   Lab Results  Component Value Date   HGBA1C 9.0* 01/11/2016  A/P: down 9 lbs but no exercise- add exercise. Increase metformin to 1g BID, amaryl 4mg  from 2mg . Follow up 3 months   Elevated LFTs  LFTs improving off statin- would love for him to be back on but holding off for now. Previous AST over 200 now normalized. If ALT normalizes- may trial atorvastatin 10mg  with clos emonitoring of LFTs   BP Readings from Last 3 Encounters:  01/15/16 148/86  07/17/15 138/70  04/23/15 124/80  noted BP trend- if continues may need medicine support- focus on continued weight loss efforts first- cannot define hypertension as first official high reading  Return in about 3 months (around 04/16/2016) for schedule labs then see me a few days later. .  Orders Placed This Encounter  Procedures  . Hemoglobin A1c    [place]    Standing Status: Future     Number of Occurrences:      Standing Expiration Date: 01/14/2017  . Comprehensive metabolic panel    [place]    Standing Status: Future     Number of Occurrences:      Standing Expiration Date: 01/14/2017  Twinrix final given   Meds ordered this encounter  Medications  . glimepiride (AMARYL) 4 MG tablet    Sig: Take 1 tablet (4 mg total) by mouth daily with breakfast.    Dispense:  90 tablet    Refill:  3  . metFORMIN (GLUCOPHAGE) 1000 MG tablet    Sig: Take 1 tablet (1,000 mg total) by mouth 2 (two) times daily with a meal.    Dispense:  180 tablet    Refill:  3    Return precautions advised.    [name], MD

## 2016-01-15 NOTE — Addendum Note (Signed)
Addended by: Mariam Dollar, Roselyn Reef M on: 01/15/2016 03:05 PM   Modules accepted: Orders

## 2016-01-15 NOTE — Assessment & Plan Note (Signed)
S: poor control on Metformin 500mg  BID, amaryl 2mg   Lab Results  Component Value Date   HGBA1C 9.0* 01/11/2016  A/P: down 9 lbs but no exercise- add exercise. Increase metformin to 1g BID, amaryl 4mg  from 2mg . Follow up 3 months

## 2016-01-15 NOTE — Progress Notes (Signed)
Pre visit review using our clinic review tool, if applicable. No additional management support is needed unless otherwise documented below in the visit note. 

## 2016-01-20 ENCOUNTER — Telehealth: Payer: Self-pay | Admitting: Family Medicine

## 2016-01-20 NOTE — Telephone Encounter (Signed)
Pharmacy need clarification for Test strips and lancets Rx was written for once a day and pt states he only checks it once a week.

## 2016-01-25 NOTE — Telephone Encounter (Signed)
Spoke with pharmacist who stated patient had called and clarified and they had already shipped out supplies.

## 2016-04-13 ENCOUNTER — Other Ambulatory Visit (INDEPENDENT_AMBULATORY_CARE_PROVIDER_SITE_OTHER): Payer: 59

## 2016-04-13 DIAGNOSIS — E119 Type 2 diabetes mellitus without complications: Secondary | ICD-10-CM | POA: Diagnosis not present

## 2016-04-13 LAB — COMPREHENSIVE METABOLIC PANEL
ALBUMIN: 3.9 g/dL (ref 3.5–5.2)
ALT: 47 U/L (ref 0–53)
AST: 32 U/L (ref 0–37)
Alkaline Phosphatase: 63 U/L (ref 39–117)
BILIRUBIN TOTAL: 0.6 mg/dL (ref 0.2–1.2)
BUN: 9 mg/dL (ref 6–23)
CALCIUM: 8.9 mg/dL (ref 8.4–10.5)
CHLORIDE: 104 meq/L (ref 96–112)
CO2: 25 mEq/L (ref 19–32)
CREATININE: 0.84 mg/dL (ref 0.40–1.50)
GFR: 99.4 mL/min (ref >60.00)
Glucose, Bld: 128 mg/dL — ABNORMAL HIGH (ref 70–99)
Potassium: 3.7 mEq/L (ref 3.5–5.1)
Sodium: 137 mEq/L (ref 135–145)
Total Protein: 7 g/dL (ref 6.0–8.3)

## 2016-04-13 LAB — HEMOGLOBIN A1C: Hgb A1c MFr Bld: 7.4 % — ABNORMAL HIGH (ref 4.6–6.5)

## 2016-04-20 ENCOUNTER — Ambulatory Visit (INDEPENDENT_AMBULATORY_CARE_PROVIDER_SITE_OTHER): Payer: 59 | Admitting: Family Medicine

## 2016-04-20 ENCOUNTER — Encounter: Payer: Self-pay | Admitting: Family Medicine

## 2016-04-20 VITALS — BP 138/82 | HR 83 | Temp 97.9°F | Wt 223.4 lb

## 2016-04-20 DIAGNOSIS — E785 Hyperlipidemia, unspecified: Secondary | ICD-10-CM

## 2016-04-20 DIAGNOSIS — E119 Type 2 diabetes mellitus without complications: Secondary | ICD-10-CM

## 2016-04-20 MED ORDER — SITAGLIPTIN PHOSPHATE 100 MG PO TABS: 100.0000 mg | ORAL_TABLET | Freq: Every day | ORAL | 3 refills | Status: DC

## 2016-04-20 MED ORDER — METHYLPREDNISOLONE ACETATE 80 MG/ML IJ SUSP: 40.0000 mg | Freq: Once | INTRAMUSCULAR | Status: DC

## 2016-04-20 NOTE — Patient Instructions (Addendum)
Liver looks better- stay off cholesterol medicine  Add Januvia 100mg . Let me know if you are getting any sugars under 70 with this addition (I may reduce your amaryl)

## 2016-04-20 NOTE — Progress Notes (Signed)
Pre visit review using our clinic review tool, if applicable. No additional management support is needed unless otherwise documented below in the visit note. 

## 2016-04-20 NOTE — Assessment & Plan Note (Signed)
S: suspect poor control Elevated LFTs on simvastatin so have had to stop. Resolve off medicine A/P: remain off statin unfortunately but do not want to put liver at risk. No other caues wer efound.

## 2016-04-20 NOTE — Progress Notes (Signed)
Subjective:  Juan Bennett is a 60 y.o. year old very pleasant male patient who presents for/with See problem oriented charting ROS- No chest pain or shortness of breath. No headache or blurry vision.  No hypoglycemia.see any ROS included in HPI as well.   Past Medical History-  Patient Active Problem List   Diagnosis Date Noted  . Diabetes mellitus type II, controlled ([place]) 09/04/2007    Priority: High  . Elevated LFTs 12/08/2014    Priority: Medium  . Goiter 10/15/2009    Priority: Medium  . Hyperlipidemia 09/04/2007    Priority: Medium  . Jock itch 07/30/2014    Priority: Low  . ALLERGIC RHINITIS 09/04/2007    Priority: Low    Medications- reviewed and updated Current Outpatient Prescriptions  Medication Sig Dispense Refill  . aspirin 325 MG tablet Take 325 mg by mouth daily.      Marland Kitchen glimepiride (AMARYL) 4 MG tablet Take 1 tablet (4 mg total) by mouth daily with breakfast. 90 tablet 3  . glucose blood (ONETOUCH VERIO) test strip Use to check blood sugar daily and PRN 100 each 4  . metFORMIN (GLUCOPHAGE) 1000 MG tablet Take 1 tablet (1,000 mg total) by mouth 2 (two) times daily with a meal. 180 tablet 3  . ONETOUCH DELICA LANCETS FINE MISC Use to check blood sugar daily and PRN 100 each 4  . sitaGLIPtin (JANUVIA) 100 MG tablet Take 1 tablet (100 mg total) by mouth daily. 90 tablet 3   Current Facility-Administered Medications  Medication Dose Route Frequency Provider Last Rate Last Dose  . methylPREDNISolone acetate (DEPO-MEDROL) injection 40 mg  40 mg Intramuscular Once Marin Olp, MD        Objective: BP 138/82 (BP Location: Left Arm, Patient Position: Sitting, Cuff Size: Normal)   Pulse 83   Temp 97.9 F (36.6 C) (Oral)   Wt 223 lb 6.4 oz (101.3 kg)   SpO2 96%   BMI 30.09 kg/m  Gen: NAD, resting comfortably CV: RRR no murmurs rubs or gallops Lungs: CTAB no crackles, wheeze, rhonchi Ext: no edema Skin: warm, dry Neuro: grossly normal, moves all  extremities  Assessment/Plan:  Diabetes mellitus type II, controlled ([place]) S: improved control On metformin 1g BID (from 500 twice a day), amaryl 4mg  (from 2mg ) Exercise and diet- weight up 2 lbs, did not start exercising Lab Results  Component Value Date   HGBA1C 7.4 (H) 04/13/2016   HGBA1C 9.0 (H) 01/11/2016   HGBA1C 8.3 (H) 04/23/2015   A/P: we discussed increasing amaryl vs. Adding [place] and opted to add [place] with hopes of a1c under 7 by 3-4 month follow up. POC at that time. Hopeful may be able to reduce amaryl further if can lose some weight  Hyperlipidemia S: suspect poor control Elevated LFTs on simvastatin so have had to stop. Resolve off medicine A/P: remain off statin unfortunately but do not want to put liver at risk. No other caues wer efound.    Return in about 4 months (around 08/20/2016) for physical.  Meds ordered this encounter  Medications  . sitaGLIPtin (JANUVIA) 100 MG tablet    Sig: Take 1 tablet (100 mg total) by mouth daily.    Dispense:  90 tablet    Refill:  3  . methylPREDNISolone acetate (DEPO-MEDROL) injection 40 mg- please note that this was not given to patient. It is being corrected in the system by London Pepper- Hizer, LPN   Return precautions advised.  [name], MD

## 2016-04-20 NOTE — Assessment & Plan Note (Signed)
S: improved control On metformin 1g BID (from 500 twice a day), amaryl 4mg  (from 2mg ) Exercise and diet- weight up 2 lbs, did not start exercising Lab Results  Component Value Date   HGBA1C 7.4 (H) 04/13/2016   HGBA1C 9.0 (H) 01/11/2016   HGBA1C 8.3 (H) 04/23/2015   A/P: we discussed increasing amaryl vs. Adding [place] and opted to add [place] with hopes of a1c under 7 by 3-4 month follow up. POC at that time. Hopeful may be able to reduce amaryl further if can lose some weight

## 2016-09-08 ENCOUNTER — Ambulatory Visit (INDEPENDENT_AMBULATORY_CARE_PROVIDER_SITE_OTHER): Payer: 59 | Admitting: Family Medicine

## 2016-09-08 ENCOUNTER — Encounter: Payer: Self-pay | Admitting: Family Medicine

## 2016-09-08 VITALS — BP 124/72 | HR 68 | Temp 97.7°F | Ht 72.25 in | Wt 228.4 lb

## 2016-09-08 DIAGNOSIS — IMO0001 Reserved for inherently not codable concepts without codable children: Secondary | ICD-10-CM

## 2016-09-08 DIAGNOSIS — E1165 Type 2 diabetes mellitus with hyperglycemia: Secondary | ICD-10-CM | POA: Diagnosis not present

## 2016-09-08 DIAGNOSIS — Z23 Encounter for immunization: Secondary | ICD-10-CM

## 2016-09-08 DIAGNOSIS — E119 Type 2 diabetes mellitus without complications: Secondary | ICD-10-CM

## 2016-09-08 LAB — POCT GLYCOSYLATED HEMOGLOBIN (HGB A1C): Hemoglobin A1C: 7.7

## 2016-09-08 MED ORDER — DAPAGLIFLOZIN PROPANEDIOL 5 MG PO TABS: 5.0000 mg | ORAL_TABLET | Freq: Every day | ORAL | 3 refills | Status: DC

## 2016-09-08 NOTE — Assessment & Plan Note (Signed)
S: poorly controlled on last check on metformin 1g BID and amaryl 4mg . We opted to add januvia at that time. Unfortunately he was unable to afford this as over 400 dollars.   Unfortunately weight is up another 5 lbs and weight up slightly. Walking has been down in winter months.  Lab Results  Component Value Date   HGBA1C 7.7 09/08/2016   HGBA1C 7.4 (H) 04/13/2016   HGBA1C 9.0 (H) 01/11/2016   A/P: we opted to pursue a trial of farxiga 5mg - he likes the idea of assisting with weight loss and had coupon. Also had tradjenta option coupon which he declined.   Continue metformin 1g BID and amaryl 4mg . Follow up 01/14/17 or later for CPE  Also discussed weight loss importance- he is off statin due to LFT elevation so needs to improve with lifestyle.

## 2016-09-08 NOTE — Progress Notes (Signed)
Pre visit review using our clinic review tool, if applicable. No additional management support is needed unless otherwise documented below in the visit note. 

## 2016-09-08 NOTE — Progress Notes (Signed)
Subjective:  Juan Bennett is a 61 y.o. year old very pleasant male patient who presents for/with See problem oriented charting ROS- no hypoglycemia. No chest pain or shortness of breath. No headache or blurry vision.    Past Medical History-  Patient Active Problem List   Diagnosis Date Noted  . Diabetes mellitus type II, uncontrolled ([place]) 09/04/2007    Priority: High  . Elevated LFTs 12/08/2014    Priority: Medium  . Goiter 10/15/2009    Priority: Medium  . Hyperlipidemia 09/04/2007    Priority: Medium  . Jock itch 07/30/2014    Priority: Low  . ALLERGIC RHINITIS 09/04/2007    Priority: Low    Medications- reviewed and updated Current Outpatient Prescriptions  Medication Sig Dispense Refill  . aspirin 325 MG tablet Take 325 mg by mouth daily.      Marland Kitchen glimepiride (AMARYL) 4 MG tablet Take 1 tablet (4 mg total) by mouth daily with breakfast. 90 tablet 3  . glucose blood (ONETOUCH VERIO) test strip Use to check blood sugar daily and PRN 100 each 4  . metFORMIN (GLUCOPHAGE) 1000 MG tablet Take 1 tablet (1,000 mg total) by mouth 2 (two) times daily with a meal. 180 tablet 3  . ONETOUCH DELICA LANCETS FINE MISC Use to check blood sugar daily and PRN 100 each 4   No current facility-administered medications for this visit.     Objective: BP 124/72 (BP Location: Left Arm, Patient Position: Sitting, Cuff Size: Large)   Pulse 68   Temp 97.7 F (36.5 C) (Oral)   Ht 6' 0.25" (1.835 m)   Wt 228 lb 6.4 oz (103.6 kg)   SpO2 97%   BMI 30.76 kg/m  Gen: NAD, resting comfortably CV: RRR no murmurs rubs or gallops Lungs: CTAB no crackles, wheeze, rhonchi Abdomen: soft/nontender/nondistended/normal bowel sounds. obese Ext: no edema Skin: warm, dry, no rash  Assessment/Plan:  Diabetes mellitus type II, uncontrolled ([facility]) S: poorly controlled on last check on metformin 1g BID and amaryl 4mg . We opted to add januvia at that time. Unfortunately he was unable to afford this as over  400 dollars.   Unfortunately weight is up another 5 lbs and weight up slightly. Walking has been down in winter months.  Lab Results  Component Value Date   HGBA1C 7.7 09/08/2016   HGBA1C 7.4 (H) 04/13/2016   HGBA1C 9.0 (H) 01/11/2016   A/P: we opted to pursue a trial of farxiga 5mg - he likes the idea of assisting with weight loss and had coupon. Also had tradjenta option coupon which he declined.   Continue metformin 1g BID and amaryl 4mg . Follow up 01/14/17 or later for CPE  Also discussed weight loss importance- he is off statin due to LFT elevation so needs to improve with lifestyle.   Discussed importance of hydration on this medication  Need for prophylactic vaccination with combined diphtheria-tetanus-pertussis (DTP) vaccine - Plan: Tdap vaccine greater than or equal to 7yo IM  Orders Placed This Encounter  Procedures  . Tdap vaccine greater than or equal to 7yo IM  . POCT glycosylated hemoglobin (Hb A1C)    Meds ordered this encounter  Medications  . dapagliflozin propanediol (FARXIGA) 5 MG TABS tablet    Sig: Take 5 mg by mouth daily.    Dispense:  90 tablet    Refill:  3    Return precautions advised.  [name], MD

## 2016-09-08 NOTE — Patient Instructions (Addendum)
Tdap today  Let's trial farxiga 5mg  instead of the [place]- hopefully the coupon card will work.   a1c 7.7- hopefully with medicine we can get you to goal under 7.   Physical on 01/14/17 or later. Labs need to be 01/10/17 or later.     Starting October 1st 2018, I will be transferring to our new location: [facility] (corner of [place] and [address] from [employer]) [place], [place] Phone: [phone]  I would love to have you remain my patient at this new location as long as it remains convenient for you. I am excited about the opportunity to have x-ray and sports medicine in the new building but will really miss the awesome staff and physicians at [facility]. Continue to schedule appointments at [facility] and we will automatically transfer them to the [place] location starting October 1st.

## 2016-09-09 ENCOUNTER — Telehealth: Payer: Self-pay

## 2016-09-09 ENCOUNTER — Other Ambulatory Visit: Payer: Self-pay

## 2016-09-09 MED ORDER — DAPAGLIFLOZIN PROPANEDIOL 5 MG PO TABS: 5.0000 mg | ORAL_TABLET | Freq: Every day | ORAL | 3 refills | Status: DC

## 2016-09-09 NOTE — Telephone Encounter (Signed)
Received PA request from [facility] for Farxiga 5 mg tablets. PA not needed per [facility], form faxed back to pharmacy.

## 2016-10-30 ENCOUNTER — Other Ambulatory Visit: Payer: Self-pay | Admitting: Family Medicine

## 2016-11-07 DIAGNOSIS — H524 Presbyopia: Secondary | ICD-10-CM | POA: Diagnosis not present

## 2016-11-07 DIAGNOSIS — H2513 Age-related nuclear cataract, bilateral: Secondary | ICD-10-CM | POA: Diagnosis not present

## 2016-11-07 DIAGNOSIS — H40013 Open angle with borderline findings, low risk, bilateral: Secondary | ICD-10-CM | POA: Diagnosis not present

## 2016-11-07 DIAGNOSIS — H52203 Unspecified astigmatism, bilateral: Secondary | ICD-10-CM | POA: Diagnosis not present

## 2016-11-07 DIAGNOSIS — E119 Type 2 diabetes mellitus without complications: Secondary | ICD-10-CM | POA: Diagnosis not present

## 2016-11-07 DIAGNOSIS — H5213 Myopia, bilateral: Secondary | ICD-10-CM | POA: Diagnosis not present

## 2016-11-07 LAB — HM DIABETES EYE EXAM

## 2016-11-14 ENCOUNTER — Encounter: Payer: Self-pay | Admitting: Family Medicine

## 2017-01-17 ENCOUNTER — Other Ambulatory Visit: Payer: Self-pay

## 2017-01-17 ENCOUNTER — Ambulatory Visit (INDEPENDENT_AMBULATORY_CARE_PROVIDER_SITE_OTHER): Payer: 59 | Admitting: Family Medicine

## 2017-01-17 ENCOUNTER — Encounter: Payer: Self-pay | Admitting: Family Medicine

## 2017-01-17 VITALS — BP 138/76 | HR 70 | Temp 98.0°F | Ht 72.0 in | Wt 218.0 lb

## 2017-01-17 DIAGNOSIS — Z125 Encounter for screening for malignant neoplasm of prostate: Secondary | ICD-10-CM | POA: Diagnosis not present

## 2017-01-17 DIAGNOSIS — Z0000 Encounter for general adult medical examination without abnormal findings: Secondary | ICD-10-CM

## 2017-01-17 DIAGNOSIS — IMO0001 Reserved for inherently not codable concepts without codable children: Secondary | ICD-10-CM

## 2017-01-17 DIAGNOSIS — E1165 Type 2 diabetes mellitus with hyperglycemia: Secondary | ICD-10-CM

## 2017-01-17 DIAGNOSIS — Z23 Encounter for immunization: Secondary | ICD-10-CM

## 2017-01-17 DIAGNOSIS — E049 Nontoxic goiter, unspecified: Secondary | ICD-10-CM

## 2017-01-17 DIAGNOSIS — E785 Hyperlipidemia, unspecified: Secondary | ICD-10-CM | POA: Diagnosis not present

## 2017-01-17 LAB — PSA: PSA: 0.66 ng/mL (ref 0.10–4.00)

## 2017-01-17 LAB — COMPREHENSIVE METABOLIC PANEL
ALBUMIN: 4.3 g/dL (ref 3.5–5.2)
ALK PHOS: 62 U/L (ref 39–117)
ALT: 35 U/L (ref 0–53)
AST: 21 U/L (ref 0–37)
BILIRUBIN TOTAL: 0.4 mg/dL (ref 0.2–1.2)
BUN: 10 mg/dL (ref 6–23)
CO2: 24 mEq/L (ref 19–32)
CREATININE: 0.88 mg/dL (ref 0.40–1.50)
Calcium: 9.5 mg/dL (ref 8.4–10.5)
Chloride: 107 mEq/L (ref 96–112)
GFR: 93.96 mL/min (ref >60.00)
Glucose, Bld: 132 mg/dL — ABNORMAL HIGH (ref 70–99)
Potassium: 4 mEq/L (ref 3.5–5.1)
SODIUM: 137 meq/L (ref 135–145)
TOTAL PROTEIN: 6.9 g/dL (ref 6.0–8.3)

## 2017-01-17 LAB — CBC
HCT: 46.3 % (ref 39.0–52.0)
Hemoglobin: 15.4 g/dL (ref 13.0–17.0)
MCHC: 33.3 g/dL (ref 30.0–36.0)
MCV: 85.4 fl (ref 78.0–100.0)
PLATELETS: 205 10*3/uL (ref 150.0–400.0)
RBC: 5.42 Mil/uL (ref 4.22–5.81)
RDW: 14.6 % (ref 11.5–15.5)
WBC: 4.6 10*3/uL (ref 4.0–10.5)

## 2017-01-17 LAB — TSH: TSH: 2.17 u[IU]/mL (ref 0.35–4.50)

## 2017-01-17 LAB — MICROALBUMIN / CREATININE URINE RATIO
Creatinine,U: 79.3 mg/dL
MICROALB/CREAT RATIO: 0.9 mg/g (ref 0.0–30.0)
Microalb, Ur: <0.7 mg/dL (ref 0.0–1.9)

## 2017-01-17 LAB — LDL CHOLESTEROL, DIRECT: Direct LDL: 204 mg/dL

## 2017-01-17 LAB — LIPID PANEL
CHOL/HDL RATIO: 6
Cholesterol: 302 mg/dL — ABNORMAL HIGH (ref 0–200)
HDL: 48.3 mg/dL (ref >39.00)
NonHDL: 253.68
Triglycerides: 233 mg/dL — ABNORMAL HIGH (ref 0.0–149.0)
VLDL: 46.6 mg/dL — AB (ref 0.0–40.0)

## 2017-01-17 LAB — HEMOGLOBIN A1C: Hgb A1c MFr Bld: 7.2 % — ABNORMAL HIGH (ref 4.6–6.5)

## 2017-01-17 MED ORDER — METFORMIN HCL 1000 MG PO TABS: 1000.0000 mg | ORAL_TABLET | Freq: Two times a day (BID) | ORAL | 3 refills | Status: DC

## 2017-01-17 MED ORDER — GLIMEPIRIDE 4 MG PO TABS: 4.0000 mg | ORAL_TABLET | Freq: Every day | ORAL | 3 refills | Status: DC

## 2017-01-17 MED ORDER — GLIMEPIRIDE 4 MG PO TABS: 4.0000 mg | ORAL_TABLET | Freq: Every day | ORAL | 0 refills | Status: DC

## 2017-01-17 NOTE — Progress Notes (Signed)
Phone: [phone]  Subjective:  Patient presents today for their annual physical. Chief complaint-noted.   See problem oriented charting- ROS- full  review of systems was completed and negative including no hypoglycemia. No chest pain or shortness of breath noted either. No RUQ pain.   The following were reviewed and entered/updated in epic: Past Medical History:  Diagnosis Date  . Allergy   . Diabetes mellitus   . Hyperlipidemia    Patient Active Problem List   Diagnosis Date Noted  . Diabetes mellitus type II, uncontrolled ([place]) 09/04/2007    Priority: High  . Elevated LFTs 12/08/2014    Priority: Medium  . Goiter 10/15/2009    Priority: Medium  . Hyperlipidemia 09/04/2007    Priority: Medium  . Jock itch 07/30/2014    Priority: Low  . ALLERGIC RHINITIS 09/04/2007    Priority: Low   No past surgical history on file.  Family History  Problem Relation Age of Onset  . Diabetes Mother   . Deep vein thrombosis Father   . Diabetes Paternal Uncle     Medications- reviewed and updated Current Outpatient Prescriptions  Medication Sig Dispense Refill  . aspirin 325 MG tablet Take 325 mg by mouth daily.      . dapagliflozin propanediol (FARXIGA) 5 MG TABS tablet Take 5 mg by mouth daily. 90 tablet 3  . glucose blood (ONETOUCH VERIO) test strip Use to check blood sugar daily and PRN 100 each 4  . ONETOUCH DELICA LANCETS FINE MISC Use to check blood sugar daily and PRN 100 each 4  . glimepiride (AMARYL) 4 MG tablet Take 1 tablet (4 mg total) by mouth daily with breakfast. 90 tablet 3  . metFORMIN (GLUCOPHAGE) 1000 MG tablet Take 1 tablet (1,000 mg total) by mouth 2 (two) times daily with a meal. 180 tablet 3   No current facility-administered medications for this visit.     Allergies-reviewed and updated No Known Allergies  Social History   Social History  . Marital status: Married    Spouse name: N/A  . Number of children: N/A  . Years of education: N/A    Social History Main Topics  . Smoking status: Former Smoker    Types: Cigarettes, Cigars  . Smokeless tobacco: Never Used     Comment: occasional cigars 6-8 per year. quit many years ago.   . Alcohol use 0.0 oz/week     Comment: weekends. 4-6 mixed drinks over the full weekend  . Drug use: No  . Sexual activity: Not Asked   Other Topics Concern  . None   Social History Narrative   Married and has rising senior 01/2014 daughter. Married since 73. Retired age 71.    Work at [employer] long time.       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 Oropharynx normal Neck: no thyromegaly CV: RRR no murmurs rubs or gallops Lungs: CTAB no crackles, wheeze, rhonchi Abdomen: soft/nontender/nondistended/normal bowel sounds. No rebound or guarding.  Ext: no edema Skin: warm, dry Neuro: grossly normal, moves all extremities, PERRLA Rectal: normal tone, normal sized prostate,  no masses or tenderness   Diabetic Foot Exam - Simple   Simple Foot Form Diabetic Foot exam was performed with the following findings:  Yes 01/17/2017  9:29 AM  Visual Inspection No deformities, no ulcerations, no other skin breakdown bilaterally:  Yes Sensation Testing Intact to touch and monofilament testing bilaterally:  Yes Pulse Check Posterior Tibialis and Dorsalis pulse intact bilaterally:  Yes Comments    Assessment/Plan:  61 y.o. male presenting for annual physical.  Health Maintenance counseling: 1. Anticipatory guidance: Patient counseled regarding regular dental exams q6 months, eye exams yearly, wearing seatbelts.  2. Risk factor reduction:   Advised patient of need for regular exercise and diet rich and fruits and vegetables to reduce risk of heart attack and stroke. Exercise- not in walking routine as hoped- advised of regular exercise. Diet-doing reasonable job getting veggies/fruits in. Takes home portion when he eats out- lower portion size. Set goal 200 within a year  Wt Readings from Last 3 Encounters:  01/17/17 218 lb (98.9 kg)  09/08/16 228 lb 6.4 oz (103.6 kg)  04/20/16 223 lb 6.4 oz (101.3 kg)  3. Immunizations/screenings/ancillary studies- offered shingrix - opts in Immunization History  Administered Date(s) Administered  . Hep A / Hep B 07/17/2015, 08/17/2015, 01/15/2016  . Influenza Whole 05/25/2009  . Influenza,inj,Quad PF,36+ Mos 04/23/2015  . Influenza-Unspecified 06/08/2014, 03/28/2016  . Pneumococcal Conjugate-13 09/29/2008  . Pneumococcal Polysaccharide-23 09/29/2008, 02/20/2014  . Td 08/25/2006  . Tdap 09/08/2016  4. Prostate cancer screening-  will update psa. Rectal exam low risk.  Lab Results  Component Value Date   PSA 0.55 01/11/2016   PSA 0.50 02/08/2013   PSA 0.48 11/11/2010   5. Colon cancer screening - 10/26/2007 with 10 year repeat- will be due next year.  6. Skin cancer screening- declines dermatology visit. Has several seborrheic keratosis but none obvious changing. Encouraged him to have wife monitor his back for changes. He has a larger one on left shoulder and in left groin stable for years.   Status of chronic or acute concerns   HLD- had been taken off statin due to LFT elevation and improved . Update LFts today and consider starting atorvastatin 10mg  if still normalized and lipids remain elevated with LDL above 100. On aspirin 325 mg  History of goiter- will update tsh. Exam stable  Elevated BP- just under goal today. He lost 10 lbs from last visit but unfortunately did not see drop off in BP BP Readings from Last 3 Encounters:  01/17/17 138/76  09/08/16 124/72  04/20/16 138/82    Diabetes mellitus type II, uncontrolled ([place]) Diabetes- has been on amaryl 4mg , metformin 1g BID. Last visit added farxiga. Does not check sugar- declines lows.  Lab Results  Component Value Date   HGBA1C 7.7 09/08/2016  a1c has been trended down from 9 and then bounced back to 7.7 from 7.4 last visit.  Hopeful controlled with farxiga and weight loss  Return in about 4 months (around 05/19/2017).  Orders Placed This Encounter  Procedures  . Varicella-zoster vaccine IM  . PSA  . Lipid panel    [place]    Order Specific Question:   Has the patient fasted?    Answer:   No  . CBC    [place]  . Comprehensive metabolic panel    [place]    Order Specific Question:   Has the patient fasted?    Answer:   No  . TSH    [place]  . Hemoglobin A1c  [place]  . Microalbumin / creatinine urine ratio    [place]    Meds ordered this encounter  Medications  . glimepiride (AMARYL) 4 MG tablet    Sig: Take 1 tablet (4 mg total) by mouth daily with breakfast.    Dispense:  90 tablet    Refill:  3    Return precautions advised.   [name], MD

## 2017-01-17 NOTE — Assessment & Plan Note (Signed)
Diabetes- has been on amaryl 4mg , metformin 1g BID. Last visit added farxiga. Does not check sugar- declines lows.  Lab Results  Component Value Date   HGBA1C 7.7 09/08/2016  a1c has been trended down from 9 and then bounced back to 7.7 from 7.4 last visit.  Hopeful controlled with farxiga and weight loss

## 2017-01-17 NOTE — Patient Instructions (Addendum)
Shingrix #1 today. Repeat injection in 2-3 months. Schedule this before you leave.   Foot exam  Roselyn Reef refilled your meds  Please stop by lab before you go

## 2017-01-18 ENCOUNTER — Other Ambulatory Visit: Payer: Self-pay

## 2017-01-18 DIAGNOSIS — E785 Hyperlipidemia, unspecified: Secondary | ICD-10-CM

## 2017-01-18 MED ORDER — ATORVASTATIN CALCIUM 10 MG PO TABS: 10.0000 mg | ORAL_TABLET | ORAL | 3 refills | Status: DC

## 2017-01-26 ENCOUNTER — Encounter: Payer: Self-pay | Admitting: Family Medicine

## 2017-01-27 ENCOUNTER — Other Ambulatory Visit: Payer: Self-pay | Admitting: *Deleted

## 2017-01-27 DIAGNOSIS — E785 Hyperlipidemia, unspecified: Secondary | ICD-10-CM

## 2017-01-27 MED ORDER — ATORVASTATIN CALCIUM 10 MG PO TABS
10.0000 mg | ORAL_TABLET | ORAL | 3 refills | Status: DC
Start: 2017-01-27 — End: 2017-03-22

## 2017-03-20 ENCOUNTER — Other Ambulatory Visit (INDEPENDENT_AMBULATORY_CARE_PROVIDER_SITE_OTHER): Payer: 59

## 2017-03-20 ENCOUNTER — Ambulatory Visit (INDEPENDENT_AMBULATORY_CARE_PROVIDER_SITE_OTHER): Payer: 59

## 2017-03-20 DIAGNOSIS — Z23 Encounter for immunization: Secondary | ICD-10-CM

## 2017-03-20 DIAGNOSIS — E785 Hyperlipidemia, unspecified: Secondary | ICD-10-CM | POA: Diagnosis not present

## 2017-03-20 LAB — HEPATIC FUNCTION PANEL
ALT: 29 U/L (ref 0–53)
AST: 20 U/L (ref 0–37)
Albumin: 4.4 g/dL (ref 3.5–5.2)
Alkaline Phosphatase: 64 U/L (ref 39–117)
Bilirubin, Direct: 0.1 mg/dL (ref 0.0–0.3)
Total Bilirubin: 0.5 mg/dL (ref 0.2–1.2)
Total Protein: 7.2 g/dL (ref 6.0–8.3)

## 2017-03-20 LAB — LDL CHOLESTEROL, DIRECT: Direct LDL: 172 mg/dL

## 2017-03-22 ENCOUNTER — Other Ambulatory Visit: Payer: Self-pay

## 2017-03-22 DIAGNOSIS — E785 Hyperlipidemia, unspecified: Secondary | ICD-10-CM

## 2017-03-22 MED ORDER — ATORVASTATIN CALCIUM 10 MG PO TABS: 10.0000 mg | ORAL_TABLET | ORAL | 3 refills | Status: DC

## 2017-05-17 ENCOUNTER — Ambulatory Visit (INDEPENDENT_AMBULATORY_CARE_PROVIDER_SITE_OTHER): Payer: 59 | Admitting: Family Medicine

## 2017-05-17 ENCOUNTER — Encounter: Payer: Self-pay | Admitting: Family Medicine

## 2017-05-17 VITALS — BP 132/76 | HR 64 | Ht 71.5 in | Wt 212.4 lb

## 2017-05-17 DIAGNOSIS — E1165 Type 2 diabetes mellitus with hyperglycemia: Secondary | ICD-10-CM

## 2017-05-17 DIAGNOSIS — Z23 Encounter for immunization: Secondary | ICD-10-CM

## 2017-05-17 DIAGNOSIS — E785 Hyperlipidemia, unspecified: Secondary | ICD-10-CM

## 2017-05-17 LAB — COMPREHENSIVE METABOLIC PANEL
ALBUMIN: 4.4 g/dL (ref 3.5–5.2)
ALK PHOS: 56 U/L (ref 39–117)
ALT: 29 U/L (ref 0–53)
AST: 22 U/L (ref 0–37)
BILIRUBIN TOTAL: 0.5 mg/dL (ref 0.2–1.2)
BUN: 12 mg/dL (ref 6–23)
CALCIUM: 9.6 mg/dL (ref 8.4–10.5)
CHLORIDE: 104 meq/L (ref 96–112)
CO2: 24 mEq/L (ref 19–32)
CREATININE: 0.93 mg/dL (ref 0.40–1.50)
GFR: 88.06 mL/min (ref >60.00)
Glucose, Bld: 126 mg/dL — ABNORMAL HIGH (ref 70–99)
Potassium: 4.1 mEq/L (ref 3.5–5.1)
Sodium: 138 mEq/L (ref 135–145)
TOTAL PROTEIN: 7.5 g/dL (ref 6.0–8.3)

## 2017-05-17 LAB — LDL CHOLESTEROL, DIRECT: Direct LDL: 176 mg/dL

## 2017-05-17 LAB — HEMOGLOBIN A1C: Hgb A1c MFr Bld: 6.6 % — ABNORMAL HIGH (ref 4.6–6.5)

## 2017-05-17 NOTE — Addendum Note (Signed)
Addended by: Mariam Dollar, Roselyn Reef M on: 05/17/2017 10:09 AM   Modules accepted: Orders

## 2017-05-17 NOTE — Progress Notes (Signed)
Subjective:  Juan Bennett is a 61 y.o. year old very pleasant male patient who presents for/with See problem oriented charting ROS- No chest pain or shortness of breath. No headache or blurry vision.  No hypoglycemia   Past Medical History-  Patient Active Problem List   Diagnosis Date Noted  . Diabetes mellitus type II, uncontrolled ([place]) 09/04/2007    Priority: High  . Elevated LFTs 12/08/2014    Priority: Medium  . Goiter 10/15/2009    Priority: Medium  . Hyperlipidemia 09/04/2007    Priority: Medium  . Jock itch 07/30/2014    Priority: Low  . ALLERGIC RHINITIS 09/04/2007    Priority: Low    Medications- reviewed and updated Current Outpatient Prescriptions  Medication Sig Dispense Refill  . aspirin 325 MG tablet Take 325 mg by mouth daily.      Marland Kitchen atorvastatin (LIPITOR) 10 MG tablet Take 1 tablet (10 mg total) by mouth 2 (two) times a week. 24 tablet 3  . dapagliflozin propanediol (FARXIGA) 5 MG TABS tablet Take 5 mg by mouth daily. 90 tablet 3  . glimepiride (AMARYL) 4 MG tablet Take 1 tablet (4 mg total) by mouth daily with breakfast. 90 tablet 3  . glucose blood (ONETOUCH VERIO) test strip Use to check blood sugar daily and PRN (Patient not taking: Reported on 05/17/2017) 100 each 4  . metFORMIN (GLUCOPHAGE) 1000 MG tablet Take 1 tablet (1,000 mg total) by mouth 2 (two) times daily with a meal. 180 tablet 3  . ONETOUCH DELICA LANCETS FINE MISC Use to check blood sugar daily and PRN (Patient not taking: Reported on 05/17/2017) 100 each 4   No current facility-administered medications for this visit.     Objective: BP 132/76   Pulse 64   Ht 5' 11.5" (1.816 m)   Wt 212 lb 6.4 oz (96.3 kg)   SpO2 98%   BMI 29.21 kg/m  Gen: NAD, resting comfortably CV: RRR no murmurs rubs or gallops Lungs: CTAB no crackles, wheeze, rhonchi Abdomen: soft/nontender/nondistended/normal bowel sounds.overweight- prior obese Ext: no edema Skin: warm, dry Neuro: gets onto tablet  without assist  Assessment/Plan:  Elevated BPS in past currently well controlled  BP Readings from Last 3 Encounters:  05/17/17 132/76  01/17/17 138/76  09/08/16 124/72   Diabetes mellitus type II, uncontrolled ([place]) S: hopefully controlled. On amaryl 4mg , metformin 1g BID, farxiga 5mg  Exercise and diet- . Down 6 lbs in last few months! Had lost 10 prior so now 16 lbs this year Lab Results  Component Value Date   HGBA1C 7.2 (H) 01/17/2017   HGBA1C 7.7 09/08/2016   HGBA1C 7.4 (H) 04/13/2016   A/P: update a1c. If a1c above 7- increase farxiga to 10mg  or continue to monitor given weight loss  Hyperlipidemia S: suspect improving controlled on atorvastatin 10mg  twice a week. No myalgias.  Lab Results  Component Value Date   CHOL 302 (H) 01/17/2017   HDL 48.30 01/17/2017   LDLCALC 116 (H) 07/30/2014   LDLDIRECT 172.0 03/20/2017   TRIG 233.0 (H) 01/17/2017   CHOLHDL 6 01/17/2017   A/P: update LFTs (prior elevation on statin) as well as LDL today  Return in about 4 months (around 09/17/2017) for follow up- or sooner if needed.  Flu shot given today Orders Placed This Encounter  Procedures  . LDL cholesterol, direct    [place]  . Hemoglobin A1c    [place]  . Comprehensive metabolic panel    [place]   Return precautions advised.  Annie Main  Yong Channel, MD

## 2017-05-17 NOTE — Patient Instructions (Signed)
Great job with weight loss! Blood pressure looks better. Hopefully a1c under goal of 7  Please stop by lab before you go

## 2017-05-17 NOTE — Assessment & Plan Note (Signed)
S: hopefully controlled. On amaryl 4mg , metformin 1g BID, farxiga 5mg  Exercise and diet- . Down 6 lbs in last few months! Had lost 10 prior so now 16 lbs this year Lab Results  Component Value Date   HGBA1C 7.2 (H) 01/17/2017   HGBA1C 7.7 09/08/2016   HGBA1C 7.4 (H) 04/13/2016   A/P: update a1c. If a1c above 7- increase farxiga to 10mg  or continue to monitor given weight loss

## 2017-05-17 NOTE — Assessment & Plan Note (Signed)
S: suspect improving controlled on atorvastatin 10mg  twice a week. No myalgias.  Lab Results  Component Value Date   CHOL 302 (H) 01/17/2017   HDL 48.30 01/17/2017   LDLCALC 116 (H) 07/30/2014   LDLDIRECT 172.0 03/20/2017   TRIG 233.0 (H) 01/17/2017   CHOLHDL 6 01/17/2017   A/P: update LFTs (prior elevation on statin) as well as LDL today

## 2017-06-12 ENCOUNTER — Other Ambulatory Visit: Payer: Self-pay | Admitting: Family Medicine

## 2017-07-12 ENCOUNTER — Other Ambulatory Visit: Payer: Self-pay

## 2017-07-12 ENCOUNTER — Telehealth: Payer: Self-pay | Admitting: Family Medicine

## 2017-07-12 MED ORDER — DAPAGLIFLOZIN PROPANEDIOL 5 MG PO TABS: 5.0000 mg | ORAL_TABLET | Freq: Every day | ORAL | 3 refills | Status: DC

## 2017-07-12 NOTE — Telephone Encounter (Signed)
Refill sent to pharmacy.   

## 2017-07-12 NOTE — Telephone Encounter (Signed)
MEDICATION: dapagliflozin propanediol (FARXIGA) 5 MG TABS tablet  PHARMACY:   [id number] [phone] (Phone) [phone] (Fax)     IS THIS A 90 DAY SUPPLY : y  IS PATIENT OUT OF MEDICATION:   IF NOT; HOW MUCH IS LEFT:   LAST APPOINTMENT DATE: @10 /24/2018  NEXT APPOINTMENT DATE:@2 /26/2019  OTHER COMMENTS:    **Let patient know to contact pharmacy at the end of the day to make sure medication is ready. **  ** Please notify patient to allow 48-72 hours to process**  **Encourage patient to contact the pharmacy for refills or they can request refills through [facility]**

## 2017-07-24 ENCOUNTER — Encounter: Payer: Self-pay | Admitting: Family Medicine

## 2017-09-19 ENCOUNTER — Encounter: Payer: Self-pay | Admitting: Family Medicine

## 2017-09-19 ENCOUNTER — Ambulatory Visit: Payer: 59 | Admitting: Family Medicine

## 2017-09-19 VITALS — BP 112/76 | HR 71 | Temp 97.9°F | Ht 71.5 in | Wt 206.8 lb

## 2017-09-19 DIAGNOSIS — R945 Abnormal results of liver function studies: Secondary | ICD-10-CM

## 2017-09-19 DIAGNOSIS — E119 Type 2 diabetes mellitus without complications: Secondary | ICD-10-CM | POA: Diagnosis not present

## 2017-09-19 DIAGNOSIS — R7989 Other specified abnormal findings of blood chemistry: Secondary | ICD-10-CM

## 2017-09-19 DIAGNOSIS — E785 Hyperlipidemia, unspecified: Secondary | ICD-10-CM

## 2017-09-19 DIAGNOSIS — E049 Nontoxic goiter, unspecified: Secondary | ICD-10-CM

## 2017-09-19 LAB — COMPREHENSIVE METABOLIC PANEL
ALT: 26 U/L (ref 0–53)
AST: 19 U/L (ref 0–37)
Albumin: 4.4 g/dL (ref 3.5–5.2)
Alkaline Phosphatase: 61 U/L (ref 39–117)
BUN: 10 mg/dL (ref 6–23)
CALCIUM: 10 mg/dL (ref 8.4–10.5)
CHLORIDE: 105 meq/L (ref 96–112)
CO2: 21 meq/L (ref 19–32)
CREATININE: 0.98 mg/dL (ref 0.40–1.50)
GFR: 82.8 mL/min (ref >60.00)
Glucose, Bld: 123 mg/dL — ABNORMAL HIGH (ref 70–99)
Potassium: 4.2 mEq/L (ref 3.5–5.1)
SODIUM: 138 meq/L (ref 135–145)
Total Bilirubin: 0.7 mg/dL (ref 0.2–1.2)
Total Protein: 7.3 g/dL (ref 6.0–8.3)

## 2017-09-19 LAB — HEMOGLOBIN A1C: Hgb A1c MFr Bld: 6.7 % — ABNORMAL HIGH (ref 4.6–6.5)

## 2017-09-19 LAB — LDL CHOLESTEROL, DIRECT: LDL DIRECT: 170 mg/dL

## 2017-09-19 LAB — TSH: TSH: 2.53 u[IU]/mL (ref 0.35–4.50)

## 2017-09-19 NOTE — Assessment & Plan Note (Signed)
S: poorly controlled on atorvastatin 10mg  twice a week- advised to trial 3x a week  (sun, tues, thurs) if tolerable- he has done well and No myalgias. LFT elevation in past on simvastatin which resolved off medicine Lab Results  Component Value Date   CHOL 302 (H) 01/17/2017   HDL 48.30 01/17/2017   LDLCALC 116 (H) 07/30/2014   LDLDIRECT 176.0 05/17/2017   TRIG 233.0 (H) 01/17/2017   CHOLHDL 6 01/17/2017   A/P: update LDL and LFTs. Perhaps go to 4x a week if LFTs ok and bad choleterol over 100

## 2017-09-19 NOTE — Assessment & Plan Note (Signed)
Lab Results  Component Value Date   ALT 29 05/17/2017   AST 22 05/17/2017   ALKPHOS 56 05/17/2017   BILITOT 0.5 05/17/2017  normal LFTs most recently- update today as now up to 3x a week atorvastatin 10mg 

## 2017-09-19 NOTE — Patient Instructions (Addendum)
Please stop by lab before you go  4-6 month follow up  If liver ok and cholsterol above goal of 100 for bad cholesterol - we will try 4x a week atorvastatin  4-6 month follow up. Schedule physical at that time and come fasting. 6 months ok if a1c still under 7.

## 2017-09-19 NOTE — Assessment & Plan Note (Addendum)
S: thyromegaly stable on exam. No symptoms. Weight loss noted but on farxiga and improving diet A/P: update tsh

## 2017-09-19 NOTE — Progress Notes (Signed)
Subjective:  Juan Bennett is a 62 y.o. year old very pleasant male patient who presents for/with See problem oriented charting ROS- No chest pain or shortness of breath. No headache or blurry vision.  No hypoglycemia.    Past Medical History-  Patient Active Problem List   Diagnosis Date Noted  . Well controlled diabetes mellitus ([place]) 09/04/2007    Priority: High  . Elevated LFTs 12/08/2014    Priority: Medium  . Goiter 10/15/2009    Priority: Medium  . Hyperlipidemia 09/04/2007    Priority: Medium  . Jock itch 07/30/2014    Priority: Low  . ALLERGIC RHINITIS 09/04/2007    Priority: Low    Medications- reviewed and updated Current Outpatient Medications  Medication Sig Dispense Refill  . aspirin 325 MG tablet Take 325 mg by mouth daily.      Marland Kitchen atorvastatin (LIPITOR) 10 MG tablet Take 1 tablet (10 mg total) by mouth 2 (two) times a week. 24 tablet 3  . dapagliflozin propanediol (FARXIGA) 5 MG TABS tablet Take 5 mg by mouth daily. 90 tablet 3  . glimepiride (AMARYL) 4 MG tablet TAKE 1 TABLET BY MOUTH  DAILY WITH BREAKFAST 120 tablet 1  . metFORMIN (GLUCOPHAGE) 1000 MG tablet Take 1 tablet (1,000 mg total) by mouth 2 (two) times daily with a meal. 180 tablet 3   No current facility-administered medications for this visit.     Objective: BP 112/76 (BP Location: Left Arm, Patient Position: Sitting, Cuff Size: Large)   Pulse 71   Temp 97.9 F (36.6 C) (Oral)   Ht 5' 11.5" (1.816 m)   Wt 206 lb 12.8 oz (93.8 kg)   SpO2 98%   BMI 28.44 kg/m  Gen: NAD, resting comfortably CV: RRR no murmurs rubs or gallops Lungs: CTAB no crackles, wheeze, rhonchi Abdomen: soft/nontender/nondistended/normal bowel sounds. Overweight but continues to improve Ext: no edema Skin: warm, dry Neuro: normal gait and speech  Assessment/Plan:   Well controlled diabetes mellitus ([place]) S: on last visit, controlled. On amaryl 4mg , metformin 1g BID, farxiga 5mg .  Patient has lost 6 lbs.  Continues to eat. Well. States needs to get out and walk.  Lab Results  Component Value Date   HGBA1C 6.6 (H) 05/17/2017   HGBA1C 7.2 (H) 01/17/2017   HGBA1C 7.7 09/08/2016   A/P: continue current meds, update a1c and cmp. Encouraged regular exercise.   Elevated LFTs Lab Results  Component Value Date   ALT 29 05/17/2017   AST 22 05/17/2017   ALKPHOS 56 05/17/2017   BILITOT 0.5 05/17/2017  normal LFTs most recently- update today as now up to 3x a week atorvastatin 10mg   Goiter S: thyromegaly stable on exam. No symptoms. Weight loss noted but on farxiga and improving diet A/P: update tsh   Hyperlipidemia S: poorly controlled on atorvastatin 10mg  twice a week- advised to trial 3x a week  (sun, tues, thurs) if tolerable- he has done well and No myalgias. LFT elevation in past on simvastatin which resolved off medicine Lab Results  Component Value Date   CHOL 302 (H) 01/17/2017   HDL 48.30 01/17/2017   LDLCALC 116 (H) 07/30/2014   LDLDIRECT 176.0 05/17/2017   TRIG 233.0 (H) 01/17/2017   CHOLHDL 6 01/17/2017   A/P: update LDL and LFTs. Perhaps go to 4x a week if LFTs ok and bad choleterol over 100  4-6 month follow up. Schedule physical at that time and come fasting. 6 months ok if a1c still under 7.  Lab/Order associations: Well controlled diabetes mellitus ([place]) - Plan: Hemoglobin A1c  Elevated LFTs - Plan: Comprehensive metabolic panel  Goiter - Plan: TSH  Hyperlipidemia, unspecified hyperlipidemia type - Plan: LDL cholesterol, direct  Return precautions advised.  [name], MD

## 2017-09-19 NOTE — Assessment & Plan Note (Signed)
S: on last visit, controlled. On amaryl 4mg , metformin 1g BID, farxiga 5mg .  Patient has lost 6 lbs. Continues to eat. Well. States needs to get out and walk.  Lab Results  Component Value Date   HGBA1C 6.6 (H) 05/17/2017   HGBA1C 7.2 (H) 01/17/2017   HGBA1C 7.7 09/08/2016   A/P: continue current meds, update a1c and cmp. Encouraged regular exercise.

## 2017-09-20 ENCOUNTER — Other Ambulatory Visit: Payer: Self-pay

## 2017-09-20 DIAGNOSIS — E785 Hyperlipidemia, unspecified: Secondary | ICD-10-CM

## 2017-09-20 MED ORDER — ATORVASTATIN CALCIUM 20 MG PO TABS: 20.0000 mg | ORAL_TABLET | ORAL | 3 refills | Status: DC

## 2017-10-19 ENCOUNTER — Encounter: Payer: Self-pay | Admitting: Gastroenterology

## 2017-10-30 ENCOUNTER — Encounter: Payer: Self-pay | Admitting: Family Medicine

## 2017-10-30 ENCOUNTER — Other Ambulatory Visit (INDEPENDENT_AMBULATORY_CARE_PROVIDER_SITE_OTHER): Payer: 59

## 2017-10-30 DIAGNOSIS — E785 Hyperlipidemia, unspecified: Secondary | ICD-10-CM | POA: Diagnosis not present

## 2017-10-30 LAB — HEPATIC FUNCTION PANEL
ALT: 29 U/L (ref 0–53)
AST: 20 U/L (ref 0–37)
Albumin: 4.2 g/dL (ref 3.5–5.2)
Alkaline Phosphatase: 56 U/L (ref 39–117)
BILIRUBIN TOTAL: 0.5 mg/dL (ref 0.2–1.2)
Bilirubin, Direct: 0.1 mg/dL (ref 0.0–0.3)
Total Protein: 7.5 g/dL (ref 6.0–8.3)

## 2017-10-30 LAB — LDL CHOLESTEROL, DIRECT: Direct LDL: 156 mg/dL

## 2017-10-31 ENCOUNTER — Encounter: Payer: Self-pay | Admitting: Gastroenterology

## 2017-11-14 DIAGNOSIS — H40013 Open angle with borderline findings, low risk, bilateral: Secondary | ICD-10-CM | POA: Diagnosis not present

## 2017-11-14 DIAGNOSIS — E119 Type 2 diabetes mellitus without complications: Secondary | ICD-10-CM | POA: Diagnosis not present

## 2017-11-14 DIAGNOSIS — H2513 Age-related nuclear cataract, bilateral: Secondary | ICD-10-CM | POA: Diagnosis not present

## 2017-11-14 LAB — HM DIABETES EYE EXAM

## 2017-11-16 ENCOUNTER — Encounter: Payer: Self-pay | Admitting: Family Medicine

## 2017-11-27 ENCOUNTER — Telehealth: Payer: Self-pay

## 2017-11-27 ENCOUNTER — Encounter: Payer: Self-pay | Admitting: Family Medicine

## 2017-11-27 NOTE — Telephone Encounter (Signed)
Do they cover any SGLT2 inhibitors? That is what I want to know before setting up peer to peer

## 2017-11-27 NOTE — Telephone Encounter (Signed)
PA for Farxiga 5 mg tablet was denied.  Per [employer], this medication is not covered under patient's insurance plan.  Per [employer]:  "If the treating physician would like to discuss this coverage decision with the physician or health care professional reviewer, please call [employer] Prior Authorization department at [phone]"  Patient ID:  [id number]  Reference #:  [id number]

## 2017-11-27 NOTE — Telephone Encounter (Signed)
PA for Wilder Glade has been initiated through Cover My Meds.  Waiting for insurance decision.

## 2017-11-27 NOTE — Telephone Encounter (Signed)
Dr. Yong Channel, would you like to set up peer-to-peer or change medication?

## 2017-11-28 NOTE — Telephone Encounter (Signed)
Covered alternatives are: Metformin 500 mg Invokana 300 mg Jardiance 10 mg or 25 mg

## 2017-11-28 NOTE — Telephone Encounter (Signed)
Please send in Jardiance 10 mg for daily use #90 with 3 refills.  Or can send in as 30 days with 11 refills per patient preference

## 2017-11-30 ENCOUNTER — Other Ambulatory Visit: Payer: Self-pay

## 2017-11-30 MED ORDER — EMPAGLIFLOZIN 10 MG PO TABS: 10.0000 mg | ORAL_TABLET | Freq: Every day | ORAL | 3 refills | Status: DC

## 2017-11-30 NOTE — Telephone Encounter (Signed)
Called patient he wanted 90d sent to local pharmacy I have done so. Will call if any problems.

## 2017-12-15 ENCOUNTER — Other Ambulatory Visit: Payer: Self-pay

## 2017-12-15 ENCOUNTER — Encounter: Payer: Self-pay | Admitting: Family Medicine

## 2018-01-12 ENCOUNTER — Other Ambulatory Visit: Payer: Self-pay | Admitting: Family Medicine

## 2018-01-24 ENCOUNTER — Encounter: Payer: Self-pay | Admitting: Family Medicine

## 2018-01-24 ENCOUNTER — Ambulatory Visit (INDEPENDENT_AMBULATORY_CARE_PROVIDER_SITE_OTHER): Payer: 59 | Admitting: Family Medicine

## 2018-01-24 VITALS — BP 108/78 | HR 65 | Temp 97.7°F | Ht 71.5 in | Wt 209.4 lb

## 2018-01-24 DIAGNOSIS — E049 Nontoxic goiter, unspecified: Secondary | ICD-10-CM | POA: Diagnosis not present

## 2018-01-24 DIAGNOSIS — Z0000 Encounter for general adult medical examination without abnormal findings: Secondary | ICD-10-CM

## 2018-01-24 DIAGNOSIS — E785 Hyperlipidemia, unspecified: Secondary | ICD-10-CM

## 2018-01-24 DIAGNOSIS — E119 Type 2 diabetes mellitus without complications: Secondary | ICD-10-CM

## 2018-01-24 DIAGNOSIS — Z87891 Personal history of nicotine dependence: Secondary | ICD-10-CM

## 2018-01-24 DIAGNOSIS — Z125 Encounter for screening for malignant neoplasm of prostate: Secondary | ICD-10-CM | POA: Diagnosis not present

## 2018-01-24 LAB — POC URINALSYSI DIPSTICK (AUTOMATED)
Bilirubin, UA: NEGATIVE
Blood, UA: NEGATIVE
Glucose, UA: POSITIVE — AB
LEUKOCYTES UA: NEGATIVE
NITRITE UA: NEGATIVE
PROTEIN UA: NEGATIVE
Spec Grav, UA: >=1.03 — AB (ref 1.010–1.025)
Urobilinogen, UA: 0.2 E.U./dL
pH, UA: 5 (ref 5.0–8.0)

## 2018-01-24 LAB — COMPREHENSIVE METABOLIC PANEL
ALK PHOS: 59 U/L (ref 39–117)
ALT: 21 U/L (ref 0–53)
AST: 16 U/L (ref 0–37)
Albumin: 4.7 g/dL (ref 3.5–5.2)
BILIRUBIN TOTAL: 0.6 mg/dL (ref 0.2–1.2)
BUN: 13 mg/dL (ref 6–23)
CO2: 25 mEq/L (ref 19–32)
CREATININE: 0.98 mg/dL (ref 0.40–1.50)
Calcium: 9.5 mg/dL (ref 8.4–10.5)
Chloride: 105 mEq/L (ref 96–112)
GFR: 82.7 mL/min (ref >60.00)
GLUCOSE: 117 mg/dL — AB (ref 70–99)
Potassium: 3.9 mEq/L (ref 3.5–5.1)
Sodium: 139 mEq/L (ref 135–145)
TOTAL PROTEIN: 7.2 g/dL (ref 6.0–8.3)

## 2018-01-24 LAB — CBC
HCT: 47.9 % (ref 39.0–52.0)
Hemoglobin: 16.2 g/dL (ref 13.0–17.0)
MCHC: 33.9 g/dL (ref 30.0–36.0)
MCV: 90.2 fl (ref 78.0–100.0)
Platelets: 195 10*3/uL (ref 150.0–400.0)
RBC: 5.31 Mil/uL (ref 4.22–5.81)
RDW: 13.9 % (ref 11.5–15.5)
WBC: 4.9 10*3/uL (ref 4.0–10.5)

## 2018-01-24 LAB — MICROALBUMIN / CREATININE URINE RATIO
Creatinine,U: 122.1 mg/dL
MICROALB/CREAT RATIO: 1.1 mg/g (ref 0.0–30.0)
Microalb, Ur: 1.4 mg/dL (ref 0.0–1.9)

## 2018-01-24 LAB — LIPID PANEL
CHOLESTEROL: 238 mg/dL — AB (ref 0–200)
HDL: 56.8 mg/dL (ref >39.00)
LDL Cholesterol: 153 mg/dL — ABNORMAL HIGH (ref 0–99)
NONHDL: 181.29
TRIGLYCERIDES: 140 mg/dL (ref 0.0–149.0)
Total CHOL/HDL Ratio: 4
VLDL: 28 mg/dL (ref 0.0–40.0)

## 2018-01-24 LAB — TSH: TSH: 2.25 u[IU]/mL (ref 0.35–4.50)

## 2018-01-24 LAB — HEMOGLOBIN A1C: Hgb A1c MFr Bld: 6.9 % — ABNORMAL HIGH (ref 4.6–6.5)

## 2018-01-24 LAB — PSA: PSA: 0.83 ng/mL (ref 0.10–4.00)

## 2018-01-24 NOTE — Assessment & Plan Note (Signed)
S: poorly controlled on atorvastatin 10mg  TID. Has had LFT elevation in past on simvastatin which resolved off medicine Lab Results  Component Value Date   CHOL 302 (H) 01/17/2017   HDL 48.30 01/17/2017   LDLCALC 116 (H) 07/30/2014   LDLDIRECT 156.0 10/30/2017   TRIG 233.0 (H) 01/17/2017   CHOLHDL 6 01/17/2017   A/P: update LDL. If LFTs ok and LDL over 100, trial 4x a week

## 2018-01-24 NOTE — Assessment & Plan Note (Signed)
S: well controlled in past on amaryl 4mg , metformin 1g BID, farxiga 5mg . Weight up 3 lbs from last visit Lab Results  Component Value Date   HGBA1C 6.7 (H) 09/19/2017   HGBA1C 6.6 (H) 05/17/2017   HGBA1C 7.2 (H) 01/17/2017   A/P: update a1c today

## 2018-01-24 NOTE — Addendum Note (Signed)
Addended by: Marin Olp on: 01/24/2018 08:48 AM   Modules accepted: Orders

## 2018-01-24 NOTE — Patient Instructions (Addendum)
Health Maintenance Due  Topic Date Due  . COLONOSCOPY - Patient will contact his GI doctor and schedule an appointment once things calm down at home.  10/25/2017  . URINE MICROALBUMIN - Today at office visit 01/17/2018   No changes today. 6 month follow up as long as a1c remains below 7 and you keep working toward your 200 lb weight goal  Please stop by the lab before you go.

## 2018-01-24 NOTE — Assessment & Plan Note (Signed)
S: thyromegaly noted but stable. No obvious symptoms of thyroid related disease other than slightly weight gain  A/P: update tsh today

## 2018-01-24 NOTE — Progress Notes (Addendum)
Phone: [phone]  Subjective:  Patient presents today for their annual physical. Chief complaint-noted.   See problem oriented charting- ROS- full  review of systems was completed and negative except for: tinnitus  The following were reviewed and entered/updated in epic: Past Medical History:  Diagnosis Date  . Allergy   . Diabetes mellitus   . Hyperlipidemia    Patient Active Problem List   Diagnosis Date Noted  . Well controlled diabetes mellitus ([place]) 09/04/2007    Priority: High  . Elevated LFTs 12/08/2014    Priority: Medium  . Goiter 10/15/2009    Priority: Medium  . Hyperlipidemia 09/04/2007    Priority: Medium  . Jock itch 07/30/2014    Priority: Low  . ALLERGIC RHINITIS 09/04/2007    Priority: Low   History reviewed. No pertinent surgical history.  Family History  Problem Relation Age of Onset  . Diabetes Mother   . Dementia Mother        fall, hip fracture, she didnt improve  . Deep vein thrombosis Father   . Diabetes Paternal Uncle     Medications- reviewed and updated Current Outpatient Medications  Medication Sig Dispense Refill  . aspirin 325 MG tablet Take 325 mg by mouth daily.      Marland Kitchen atorvastatin (LIPITOR) 20 MG tablet Take 1 tablet (20 mg total) by mouth 3 (three) times a week. 36 tablet 3  . empagliflozin (JARDIANCE) 10 MG TABS tablet Take 10 mg by mouth daily. 90 tablet 3  . glimepiride (AMARYL) 4 MG tablet TAKE 1 TABLET BY MOUTH  DAILY WITH BREAKFAST 120 tablet 1  . metFORMIN (GLUCOPHAGE) 1000 MG tablet TAKE 1 TABLET BY MOUTH TWO  TIMES DAILY WITH A MEAL 180 tablet 1   No current facility-administered medications for this visit.     Allergies-reviewed and updated No Known Allergies  Social History   Social History Narrative   Married and has rising senior 01/2014 daughter. Married since 85. Retired age 61.    Work at [employer] long time.       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 Oropharynx normal Neck: stable thyromegaly CV: RRR no murmurs rubs or gallops Lungs: CTAB no crackles, wheeze, rhonchi Abdomen: soft/nontender/nondistended/normal bowel sounds. No rebound or guarding.  Ext: no edema Skin: warm, dry Neuro: grossly normal, moves all extremities, PERRLA Rectal: normal tone, normal sized prostate, no masses or tenderness  Diabetic Foot Exam - Simple   Simple Foot Form Diabetic Foot exam was performed with the following findings:  Yes 01/24/2018  8:45 AM  Visual Inspection No deformities, no ulcerations, no other skin breakdown bilaterally:  Yes Sensation Testing Intact to touch and monofilament testing bilaterally:  Yes Pulse Check Posterior Tibialis and Dorsalis pulse intact bilaterally:  Yes Comments    Assessment/Plan:  62 y.o. male presenting for annual physical.  Health Maintenance counseling: 1. Anticipatory guidance: Patient counseled regarding regular dental exams -q6 months, eye exams -yearly, wearing seatbelts.  2. Risk factor reduction:  Advised patient of need for regular exercise and diet rich and fruits and vegetables to reduce risk  of heart attack and stroke. Exercise- has been on back burner with loss of mother and mother in law- encouraged him to start. Diet-his weight goal was 200 within a year last year- he is halfway there down from 218. He feels eating reasonably- needs to increase his exercise.  Wt Readings from Last 3 Encounters:  01/24/18 209 lb 6.4 oz (95 kg)  09/19/17 206 lb 12.8 oz (93.8 kg)  05/17/17 212 lb 6.4 oz (96.3 kg)  3. Immunizations/screenings/ancillary studies- up to date on immunizations Immunization History    Administered Date(s) Administered  . Hep A / Hep B 07/17/2015, 08/17/2015, 01/15/2016  . Influenza Whole 05/25/2009  . Influenza,inj,Quad PF,6+ Mos 04/23/2015, 05/17/2017  . Influenza-Unspecified 06/08/2014, 03/28/2016  . Pneumococcal Conjugate-13 09/29/2008  . Pneumococcal Polysaccharide-23 09/29/2008, 02/20/2014  . Td 08/25/2006  . Tdap 09/08/2016  . Zoster Recombinat (Shingrix) 01/17/2017, 03/20/2017  4. Prostate cancer screening- will update psa. Rectal exam low risk Lab Results  Component Value Date   PSA 0.66 01/17/2017   PSA 0.55 01/11/2016   PSA 0.50 02/08/2013   5. Colon cancer screening - due for repeat colon cancer screening. Last 10/25/17. He is going to call them likely after hip replacement for wife 6. Skin cancer screening- no dermatologist. advised regular sunscreen use. Denies worrisome, changing, or new skin lesions.   Status of chronic or acute concerns   Congestion in AM from seasonal allergies. Comes and goes through years.   Well controlled diabetes mellitus ([place]) S: well controlled in past on amaryl 4mg , metformin 1g BID, farxiga 5mg . Weight up 3 lbs from last visit Lab Results  Component Value Date   HGBA1C 6.7 (H) 09/19/2017   HGBA1C 6.6 (H) 05/17/2017   HGBA1C 7.2 (H) 01/17/2017   A/P: update a1c today   Hyperlipidemia S: poorly controlled on atorvastatin 10mg  TID. Has had LFT elevation in past on simvastatin which resolved off medicine Lab Results  Component Value Date   CHOL 302 (H) 01/17/2017   HDL 48.30 01/17/2017   LDLCALC 116 (H) 07/30/2014   LDLDIRECT 156.0 10/30/2017   TRIG 233.0 (H) 01/17/2017   CHOLHDL 6 01/17/2017   A/P: update LDL. If LFTs ok and LDL over 100, trial 4x a week  Goiter S: thyromegaly noted but stable. No obvious symptoms of thyroid related disease other than slightly weight gain  A/P: update tsh today   Return in about 6 months (around 07/27/2018) for follow up- or sooner if needed.  Lab/Order  associations: Preventative health care - Plan: CBC, Comprehensive metabolic panel, Lipid panel, PSA, TSH, Hemoglobin A1c  Well controlled diabetes mellitus ([place]) - Plan: Microalbumin / creatinine urine ratio, CBC, Comprehensive metabolic panel, Lipid panel, Hemoglobin A1c, POCT Urinalysis Dipstick (Automated)  Hyperlipidemia, unspecified hyperlipidemia type  Goiter - Plan: TSH  Screening for prostate cancer - Plan: PSA  Former smoker - Plan: POCT Urinalysis Dipstick (Automated)  No orders of the defined types were placed in this encounter.   Return precautions advised.  [name], MD

## 2018-01-29 ENCOUNTER — Other Ambulatory Visit: Payer: Self-pay

## 2018-01-29 ENCOUNTER — Encounter: Payer: Self-pay | Admitting: Family Medicine

## 2018-01-29 DIAGNOSIS — E785 Hyperlipidemia, unspecified: Secondary | ICD-10-CM

## 2018-01-29 MED ORDER — ATORVASTATIN CALCIUM 20 MG PO TABS: 20.0000 mg | ORAL_TABLET | ORAL | 1 refills | Status: DC

## 2018-01-29 NOTE — Progress Notes (Signed)
Your CBC was normal (blood counts, infection fighting cells, platelets). Your CMET was normal (kidney, liver, and electrolytes, blood sugar) for someone with diabetes Your cholesterol is still above goal but with your cholesterol still being above goal lets increase your atorvastatin 20mg  to 4x a week- team please send in an updated prescription. Should be #18 per month or so or #54 per 3 months.  Normal urine diabetes test-No leakage of protein into urine related to diabetes Your thyroid was normal. Your psa trend is low risk for prostate cancer Your a1c was reasonably controlled at 6.9 with goal of 7 or less

## 2018-01-30 ENCOUNTER — Encounter: Payer: Self-pay | Admitting: Gastroenterology

## 2018-04-02 ENCOUNTER — Ambulatory Visit (AMBULATORY_SURGERY_CENTER): Payer: Self-pay | Admitting: *Deleted

## 2018-04-02 VITALS — Ht 71.0 in | Wt 210.0 lb

## 2018-04-02 DIAGNOSIS — Z1211 Encounter for screening for malignant neoplasm of colon: Secondary | ICD-10-CM

## 2018-04-02 MED ORDER — PEG-KCL-NACL-NASULF-NA ASC-C 140 G PO SOLR: 1.0000 | Freq: Once | ORAL | 0 refills | Status: AC

## 2018-04-02 NOTE — Progress Notes (Signed)
Patient denies any allergies to eggs or soy. Patient denies any problems with anesthesia/sedation. Patient denies any oxygen use at home. Patient denies taking any diet/weight loss medications or blood thinners. EMMI education offered, pt declined. Plenvu universal copay paper given to pt and explained.

## 2018-04-03 ENCOUNTER — Encounter: Payer: Self-pay | Admitting: Gastroenterology

## 2018-04-16 ENCOUNTER — Ambulatory Visit (AMBULATORY_SURGERY_CENTER): Payer: 59 | Admitting: Gastroenterology

## 2018-04-16 ENCOUNTER — Encounter: Payer: Self-pay | Admitting: Gastroenterology

## 2018-04-16 VITALS — BP 125/79 | HR 81 | Temp 97.1°F | Resp 15 | Ht 71.0 in | Wt 210.0 lb

## 2018-04-16 DIAGNOSIS — Z1211 Encounter for screening for malignant neoplasm of colon: Secondary | ICD-10-CM

## 2018-04-16 DIAGNOSIS — D120 Benign neoplasm of cecum: Secondary | ICD-10-CM

## 2018-04-16 MED ORDER — SODIUM CHLORIDE 0.9 % IV SOLN: 500.0000 mL | Freq: Once | INTRAVENOUS | Status: DC

## 2018-04-16 NOTE — Progress Notes (Signed)
Pt's states no medical or surgical changes since previsit or office visit. 

## 2018-04-16 NOTE — Patient Instructions (Signed)
YOU HAD AN ENDOSCOPIC PROCEDURE TODAY AT THE [facility]:   Refer to the procedure report that was given to you for any specific questions about what was found during the examination.  If the procedure report does not answer your questions, please call your gastroenterologist to clarify.  If you requested that your care partner not be given the details of your procedure findings, then the procedure report has been included in a sealed envelope for you to review at your convenience later.  YOU SHOULD EXPECT: Some feelings of bloating in the abdomen. Passage of more gas than usual.  Walking can help get rid of the air that was put into your GI tract during the procedure and reduce the bloating. If you had a lower endoscopy (such as a colonoscopy or flexible sigmoidoscopy) you may notice spotting of blood in your stool or on the toilet paper. If you underwent a bowel prep for your procedure, you may not have a normal bowel movement for a few days.  Please Note:  You might notice some irritation and congestion in your nose or some drainage.  This is from the oxygen used during your procedure.  There is no need for concern and it should clear up in a day or so.  SYMPTOMS TO REPORT IMMEDIATELY:   Following lower endoscopy (colonoscopy or flexible sigmoidoscopy):  Excessive amounts of blood in the stool  Significant tenderness or worsening of abdominal pains  Swelling of the abdomen that is new, acute  Fever of 100F or higher  For urgent or emergent issues, a gastroenterologist can be reached at any hour by calling [phone].   DIET:  We do recommend a small meal at first, but then you may proceed to your regular diet.  Drink plenty of fluids but you should avoid alcoholic beverages for 24 hours.  MEDICATIONS: Continue present medications.  Please see handouts given to you by your recovery nurse.  ACTIVITY:  You should plan to take it easy for the rest of today and you should NOT  DRIVE or use heavy machinery until tomorrow (because of the sedation medicines used during the test).    FOLLOW UP: Our staff will call the number listed on your records the next business day following your procedure to check on you and address any questions or concerns that you may have regarding the information given to you following your procedure. If we do not reach you, we will leave a message.  However, if you are feeling well and you are not experiencing any problems, there is no need to return our call.  We will assume that you have returned to your regular daily activities without incident.  If any biopsies were taken you will be contacted by phone or by letter within the next 1-3 weeks.  Please call us at [phone] if you have not heard about the biopsies in 3 weeks.   Thank you for allowing us to provide for your healthcare needs today.   SIGNATURES/CONFIDENTIALITY: You and/or your care partner have signed paperwork which will be entered into your electronic medical record.  These signatures attest to the fact that that the information above on your After Visit Summary has been reviewed and is understood.  Full responsibility of the confidentiality of this discharge information lies with you and/or your care-partner. 

## 2018-04-16 NOTE — Progress Notes (Signed)
A and O x3. Report to RN. Tolerated MAC anesthesia well.

## 2018-04-16 NOTE — Op Note (Signed)
[facility] Patient Name: Juan Bennett Procedure Date: 04/16/2018 11:23 AM MRN: [id number] Endoscopist: Mallie Mussel L. Loletha Carrow , MD Age: 62 Referring MD:  Date of Birth: 03/28/1956 Gender: Male Account #: [id number] Procedure:                Colonoscopy Indications:              Screening for colorectal malignant neoplasm ( no                            polyps 10/2007) Medicines:                Monitored Anesthesia Care Procedure:                Pre-Anesthesia Assessment:                           - Prior to the procedure, a History and Physical                            was performed, and patient medications and                            allergies were reviewed. The patient's tolerance of                            previous anesthesia was also reviewed. The risks                            and benefits of the procedure and the sedation                            options and risks were discussed with the patient.                            All questions were answered, and informed consent                            was obtained. Anticoagulants: The patient has taken                            aspirin. It was decided not to withhold this                            medication prior to the procedure. ASA Grade                            Assessment: II - A patient with mild systemic                            disease. After reviewing the risks and benefits,                            the patient was deemed in satisfactory condition to  undergo the procedure.                           After obtaining informed consent, the colonoscope                            was passed under direct vision. Throughout the                            procedure, the patient's blood pressure, pulse, and                            oxygen saturations were monitored continuously. The                            Colonoscope was introduced through the anus and       advanced to the the cecum, identified by                            appendiceal orifice and ileocecal valve. The                            colonoscopy was performed without difficulty. The                            patient tolerated the procedure well. The quality                            of the bowel preparation was excellent. The                            ileocecal valve, appendiceal orifice, and rectum                            were photographed. The bowel preparation used was                            Plenvu. Scope In: 11:48:24 AM Scope Out: 12:03:05 PM Scope Withdrawal Time: 0 hours 10 minutes 16 seconds  Total Procedure Duration: 0 hours 14 minutes 41 seconds  Findings:                 The perianal and digital rectal examinations were                            normal.                           A 4 mm polyp was found in the ileocecal valve. The                            polyp was sessile. The polyp was removed with a                            cold snare. Resection and retrieval were complete.  The exam was otherwise without abnormality on                            direct and retroflexion views. Complications:            No immediate complications. Estimated Blood Loss:     Estimated blood loss was minimal. Impression:               - One 4 mm polyp at the ileocecal valve, removed                            with a cold snare. Resected and retrieved.                           - The examination was otherwise normal on direct                            and retroflexion views. Recommendation:           - Patient has a contact number available for                            emergencies. The signs and symptoms of potential                            delayed complications were discussed with the                            patient. Return to normal activities tomorrow.                            Written discharge instructions were provided to the                             patient.                           - Resume previous diet.                           - Continue present medications.                           - Await pathology results.                           - Repeat colonoscopy is recommended for                            surveillance. The colonoscopy date will be                            determined after pathology results from today's                            exam become available for review. Korianna Washer L. Loletha Carrow, MD 04/16/2018 12:06:43 PM This report has  been signed electronically.

## 2018-04-16 NOTE — Progress Notes (Signed)
Called to room to assist during endoscopic procedure.  Patient ID and intended procedure confirmed with present staff. Received instructions for my participation in the procedure from the performing physician.  

## 2018-04-17 ENCOUNTER — Telehealth: Payer: Self-pay | Admitting: *Deleted

## 2018-04-17 NOTE — Telephone Encounter (Signed)
  Follow up Call-  Call back number 04/16/2018  Post procedure Call Back phone  # [phone]  Permission to leave phone message Yes  Some recent data might be hidden     Patient questions:  Do you have a fever, pain , or abdominal swelling? No. Pain Score  0 *  Have you tolerated food without any problems? Yes.    Have you been able to return to your normal activities? Yes.    Do you have any questions about your discharge instructions: Diet   No. Medications  No. Follow up visit  No.  Do you have questions or concerns about your Care? No.  Actions: * If pain score is 4 or above: No action needed, pain <4.

## 2018-04-19 ENCOUNTER — Encounter: Payer: Self-pay | Admitting: Gastroenterology

## 2018-04-20 ENCOUNTER — Encounter: Payer: Self-pay | Admitting: Family Medicine

## 2018-04-20 DIAGNOSIS — Z86010 Personal history of colonic polyps: Secondary | ICD-10-CM | POA: Insufficient documentation

## 2018-04-24 ENCOUNTER — Other Ambulatory Visit: Payer: Self-pay | Admitting: Family Medicine

## 2018-05-15 ENCOUNTER — Encounter: Payer: Self-pay | Admitting: Family Medicine

## 2018-07-15 ENCOUNTER — Encounter: Payer: Self-pay | Admitting: Family Medicine

## 2018-07-15 ENCOUNTER — Other Ambulatory Visit: Payer: Self-pay | Admitting: Family Medicine

## 2018-07-15 DIAGNOSIS — E785 Hyperlipidemia, unspecified: Secondary | ICD-10-CM

## 2018-07-16 ENCOUNTER — Other Ambulatory Visit: Payer: Self-pay

## 2018-07-16 DIAGNOSIS — E785 Hyperlipidemia, unspecified: Secondary | ICD-10-CM

## 2018-07-16 MED ORDER — ATORVASTATIN CALCIUM 20 MG PO TABS: 20.0000 mg | ORAL_TABLET | ORAL | 1 refills | Status: DC

## 2018-07-24 ENCOUNTER — Ambulatory Visit: Payer: 59 | Admitting: Family Medicine

## 2018-07-24 ENCOUNTER — Encounter: Payer: Self-pay | Admitting: Family Medicine

## 2018-07-24 VITALS — BP 94/62 | HR 60 | Temp 97.6°F | Ht 71.0 in | Wt 210.0 lb

## 2018-07-24 DIAGNOSIS — Z6829 Body mass index (BMI) 29.0-29.9, adult: Secondary | ICD-10-CM

## 2018-07-24 DIAGNOSIS — E119 Type 2 diabetes mellitus without complications: Secondary | ICD-10-CM

## 2018-07-24 DIAGNOSIS — E785 Hyperlipidemia, unspecified: Secondary | ICD-10-CM | POA: Diagnosis not present

## 2018-07-24 LAB — POCT GLYCOSYLATED HEMOGLOBIN (HGB A1C): HEMOGLOBIN A1C: 6.3 % — AB (ref 4.0–5.6)

## 2018-07-24 LAB — COMPREHENSIVE METABOLIC PANEL
ALBUMIN: 4.6 g/dL (ref 3.5–5.2)
ALK PHOS: 63 U/L (ref 39–117)
ALT: 24 U/L (ref 0–53)
AST: 19 U/L (ref 0–37)
BUN: 11 mg/dL (ref 6–23)
CALCIUM: 9.8 mg/dL (ref 8.4–10.5)
CO2: 26 mEq/L (ref 19–32)
Chloride: 105 mEq/L (ref 96–112)
Creatinine, Ser: 0.95 mg/dL (ref 0.40–1.50)
GFR: 85.58 mL/min (ref >60.00)
Glucose, Bld: 118 mg/dL — ABNORMAL HIGH (ref 70–99)
POTASSIUM: 4.3 meq/L (ref 3.5–5.1)
Sodium: 139 mEq/L (ref 135–145)
TOTAL PROTEIN: 7.1 g/dL (ref 6.0–8.3)
Total Bilirubin: 0.7 mg/dL (ref 0.2–1.2)

## 2018-07-24 LAB — LDL CHOLESTEROL, DIRECT: Direct LDL: 150 mg/dL

## 2018-07-24 NOTE — Assessment & Plan Note (Signed)
S: Poorly controlled on atorvastatin 10 mg 3 times weekly- has had LFT elevation with higher dose statins in the past- we cautiously increased to 4 times weekly last visit  Lab Results  Component Value Date   CHOL 238 (H) 01/24/2018   HDL 56.80 01/24/2018   LDLCALC 153 (H) 01/24/2018   LDLDIRECT 156.0 10/30/2017   TRIG 140.0 01/24/2018   CHOLHDL 4 01/24/2018   A/P: hopefully improved- need to update LFTs and will also check direct LDL to see if any improvement

## 2018-07-24 NOTE — Patient Instructions (Addendum)
Please stop by lab before you go   June or July for physical (I think with [facility] its just once per calendar year instead of 365 days)

## 2018-07-24 NOTE — Progress Notes (Signed)
Subjective:  Juan Bennett is a 62 y.o. year old very pleasant male patient who presents for/with See problem oriented charting ROS-no reported hypoglycemia.  No reported chest pain or shortness of breath.  No edema.  Past Medical History-  Patient Active Problem List   Diagnosis Date Noted  . Well controlled diabetes mellitus ([place]) 09/04/2007    Priority: High  . Elevated LFTs 12/08/2014    Priority: Medium  . Goiter 10/15/2009    Priority: Medium  . Hyperlipidemia 09/04/2007    Priority: Medium  . Jock itch 07/30/2014    Priority: Low  . ALLERGIC RHINITIS 09/04/2007    Priority: Low  . History of adenomatous polyp of colon 04/20/2018    Medications- reviewed and updated Current Outpatient Medications  Medication Sig Dispense Refill  . aspirin 325 MG tablet Take 325 mg by mouth daily.      Marland Kitchen atorvastatin (LIPITOR) 20 MG tablet TAKE 1 TABLET BY MOUTH 4  TIMES A WEEK. 48 tablet 1  . empagliflozin (JARDIANCE) 10 MG TABS tablet Take 10 mg by mouth daily. 90 tablet 3  . glimepiride (AMARYL) 4 MG tablet TAKE 1 TABLET BY MOUTH  DAILY WITH BREAKFAST 120 tablet 1  . metFORMIN (GLUCOPHAGE) 1000 MG tablet TAKE 1 TABLET BY MOUTH  TWICE A DAY WITH MEALS 240 tablet 1   No current facility-administered medications for this visit.     Objective: BP 94/62 (BP Location: Left Arm, Patient Position: Sitting, Cuff Size: Large)   Pulse 60   Temp 97.6 F (36.4 C) (Oral)   Ht 5\' 11"  (1.803 m)   Wt 210 lb (95.3 kg)   SpO2 96%   BMI 29.29 kg/m  Gen: NAD, resting comfortably CV: RRR no murmurs rubs or gallops Lungs: CTAB no crackles, wheeze, rhonchi Abdomen: soft/nontender/nondistended/overweight Ext: no edema Skin: warm, dry Neuro: Speech normal, moves all extremities  Assessment/Plan:   Other notes: 1.weight stable from last visit- he would like to work toward 200 eventually. Not exercising much- encouraged to start. Food choices reasonable- he feels really needs to add exercise    Well controlled diabetes mellitus ([place]) S: Well controlled on Amaryl 4 mg, metformin 1 g twice daily, Jardiance 10 mg-had to change due to formulary CBGs- denies hypoglycemia Exercise and diet-weight stable from last visit Lab Results  Component Value Date   HGBA1C 6.3 (A) 07/24/2018   HGBA1C 6.9 (H) 01/24/2018   HGBA1C 6.7 (H) 09/19/2017   A/P: stable- continue current rx   Hyperlipidemia S: Poorly controlled on atorvastatin 10 mg 3 times weekly- has had LFT elevation with higher dose statins in the past- we cautiously increased to 4 times weekly last visit  Lab Results  Component Value Date   CHOL 238 (H) 01/24/2018   HDL 56.80 01/24/2018   LDLCALC 153 (H) 01/24/2018   LDLDIRECT 156.0 10/30/2017   TRIG 140.0 01/24/2018   CHOLHDL 4 01/24/2018   A/P: hopefully improved- need to update LFTs and will also check direct LDL to see if any improvement   Approximately 6 months for physical-on [place] healthcare so does not have to be 365 days from last physical  Lab/Order associations: Well controlled diabetes mellitus ([place]) - Plan: POCT glycosylated hemoglobin (Hb A1C), LDL cholesterol, direct, Comprehensive metabolic panel  Hyperlipidemia, unspecified hyperlipidemia type  BMI 29.0-29.9,adult  Return precautions advised.  [name], MD

## 2018-07-24 NOTE — Assessment & Plan Note (Signed)
S: Well controlled on Amaryl 4 mg, metformin 1 g twice daily, Jardiance 10 mg-had to change due to formulary CBGs- denies hypoglycemia Exercise and diet-weight stable from last visit Lab Results  Component Value Date   HGBA1C 6.3 (A) 07/24/2018   HGBA1C 6.9 (H) 01/24/2018   HGBA1C 6.7 (H) 09/19/2017   A/P: stable- continue current rx

## 2018-11-16 ENCOUNTER — Other Ambulatory Visit: Payer: Self-pay | Admitting: Family Medicine

## 2018-11-16 NOTE — Telephone Encounter (Signed)
Called and scheduled med f/u for this coming Monday at 9:40.

## 2018-11-16 NOTE — Telephone Encounter (Signed)
Last OV 07/24/2018 Last refill 11/30/17 #90/3 Next OV not scheduled

## 2018-11-19 ENCOUNTER — Ambulatory Visit (INDEPENDENT_AMBULATORY_CARE_PROVIDER_SITE_OTHER): Payer: 59 | Admitting: Family Medicine

## 2018-11-19 ENCOUNTER — Encounter: Payer: Self-pay | Admitting: Family Medicine

## 2018-11-19 VITALS — Ht 71.0 in

## 2018-11-19 DIAGNOSIS — E663 Overweight: Secondary | ICD-10-CM

## 2018-11-19 DIAGNOSIS — J301 Allergic rhinitis due to pollen: Secondary | ICD-10-CM | POA: Diagnosis not present

## 2018-11-19 DIAGNOSIS — R945 Abnormal results of liver function studies: Secondary | ICD-10-CM

## 2018-11-19 DIAGNOSIS — Z6829 Body mass index (BMI) 29.0-29.9, adult: Secondary | ICD-10-CM

## 2018-11-19 DIAGNOSIS — E785 Hyperlipidemia, unspecified: Secondary | ICD-10-CM

## 2018-11-19 DIAGNOSIS — E119 Type 2 diabetes mellitus without complications: Secondary | ICD-10-CM

## 2018-11-19 DIAGNOSIS — R7989 Other specified abnormal findings of blood chemistry: Secondary | ICD-10-CM

## 2018-11-19 DIAGNOSIS — E1169 Type 2 diabetes mellitus with other specified complication: Secondary | ICD-10-CM | POA: Diagnosis not present

## 2018-11-19 NOTE — Patient Instructions (Addendum)
Health Maintenance Due  Topic Date Due  . OPHTHALMOLOGY EXAM   Resheduled due to COVID-19 11/15/2018   Depression screen [facility] 2/9 07/24/2018 05/17/2017  Decreased Interest 0 0  Down, Depressed, Hopeless 0 0  PHQ - 2 Score 0 0   Video visit-ultimately done by phone due to video equipment failure

## 2018-11-19 NOTE — Progress Notes (Signed)
Phone [phone]   Subjective:  Virtual visit via Video note. Chief complaint: Chief Complaint  Patient presents with  . Follow-up    Diabetes follow up   This visit type was conducted due to national recommendations for restrictions regarding the COVID-19 Pandemic (e.g. social distancing).  This format is felt to be most appropriate for this patient at this time balancing risks to patient and risks to population by having him in for in person visit.  No physical exam was performed (except for noted visual exam or audio findings with Telehealth visits).    Our team/I attempted to connect with Chaney Malling on 11/19/18 at  9:40 AM EDT by a video enabled telemedicine application ([email or web address])   -Interactive audio and video telecommunications were attempted between this provider and patient, however failed, due to patient having technical difficulties OR patient did not have access to video capability.  We continued and completed visit with audio only.We  verified that I am speaking with the correct person using two identifiers. Location patient: [place] Location provider: [facility], office Persons participating in the virtual visit:  patient  Our team/I discussed the limitations of evaluation and management by telemedicine and the availability of in person appointments. In light of current covid-19 pandemic, patient also understands that we are trying to protect them by minimizing in office contact if at all possible.  The patient expressed consent for telemedicine visit and agreed to proceed. Patient understands insurance will be billed.   ROS-  No chest pain or shortness of breath. No headache or blurry vision. No hypoglycemia.  No fever/chills. Mild runny nose  Past Medical History-  Patient Active Problem List   Diagnosis Date Noted  . Well controlled diabetes mellitus ([place]) 09/04/2007    Priority: High  . Elevated LFTs 12/08/2014    Priority: Medium  . Goiter 10/15/2009   Priority: Medium  . Hyperlipidemia associated with type 2 diabetes mellitus ([place]) 09/04/2007    Priority: Medium  . Jock itch 07/30/2014    Priority: Low  . Allergic rhinitis 09/04/2007    Priority: Low  . History of adenomatous polyp of colon 04/20/2018    Medications- reviewed and updated Current Outpatient Medications  Medication Sig Dispense Refill  . aspirin 325 MG tablet Take 325 mg by mouth daily.      Marland Kitchen atorvastatin (LIPITOR) 20 MG tablet TAKE 1 TABLET BY MOUTH 4  TIMES A WEEK. 48 tablet 1  . glimepiride (AMARYL) 4 MG tablet TAKE 1 TABLET BY MOUTH  DAILY WITH BREAKFAST 120 tablet 1  . JARDIANCE 10 MG TABS tablet TAKE 1 TABLET BY MOUTH DAILY 90 tablet 0  . metFORMIN (GLUCOPHAGE) 1000 MG tablet TAKE 1 TABLET BY MOUTH  TWICE A DAY WITH MEALS 240 tablet 1   No current facility-administered medications for this visit.      Objective:  Ht 5\' 11"  (1.803 m)   BMI 29.29 kg/m  No acute distress and voice No audible wheeze Normal speech    Assessment and Plan   #hyperlipidemia/overweight/elevated LFTs S: Improving but poorly controlled on atorvastatin 20 mg 4 times a week-balancing with the fact that he has had elevated LFTs in the past-see that discussion on problem list  Walking about every other day for 20 minutes. He is not sure about current weight but doesn't think he has lost Lab Results  Component Value Date   CHOL 238 (H) 01/24/2018   HDL 56.80 01/24/2018   LDLCALC 153 (H) 01/24/2018   LDLDIRECT 150.0  07/24/2018   TRIG 140.0 01/24/2018   CHOLHDL 4 01/24/2018   A/P:  Stable. Continue current medications.  Last time LFTs were elevated was 2017-still being cautious with increasing statin dose  For weight- continue to push for 200 lbs- he is going to try to locate his scale at home- Encouraged need for healthy eating, regular exercise, weight loss.    # Diabetes S:  controlled on last check on glimepiride 4 mg, Jardiance 10 mg, metformin 1000 mg twice a day Wt  Readings from Last 3 Encounters:  07/24/18 210 lb (95.3 kg)  04/16/18 210 lb (95.3 kg)  04/02/18 210 lb (95.3 kg)   CBGs- does not check sugar Exercise and diet-patient has been working towards a weight goal of 200. Lab Results  Component Value Date   HGBA1C 6.3 (A) 07/24/2018   HGBA1C 6.9 (H) 01/24/2018   HGBA1C 6.7 (H) 09/19/2017   A/P: Stable on last check-I think is reasonable to wait until July visit given prior excellent control with A1c.   # allergic rhinitis S:mild runny nose from allergies .  Takes occasional over-the-counter antihistamine like Claritin A/P: well controlled-sparing Claritin is reasonable  Future Appointments  Date Time Provider [place]  01/28/2019  8:20 AM Marin Olp, MD [facility]   Lab/Order associations: Well controlled diabetes mellitus ([place])  Hyperlipidemia associated with type 2 diabetes mellitus ([place])  Overweight  Seasonal allergic rhinitis due to pollen  Elevated LFTs  BMI 29.0-29.9,adult  Return precautions advised.  [name], MD

## 2018-11-21 ENCOUNTER — Other Ambulatory Visit: Payer: Self-pay | Admitting: Family Medicine

## 2018-11-24 ENCOUNTER — Other Ambulatory Visit: Payer: Self-pay | Admitting: Family Medicine

## 2018-11-24 DIAGNOSIS — E785 Hyperlipidemia, unspecified: Secondary | ICD-10-CM

## 2019-01-28 ENCOUNTER — Ambulatory Visit (INDEPENDENT_AMBULATORY_CARE_PROVIDER_SITE_OTHER): Payer: 59 | Admitting: Family Medicine

## 2019-01-28 ENCOUNTER — Other Ambulatory Visit: Payer: Self-pay

## 2019-01-28 ENCOUNTER — Encounter: Payer: Self-pay | Admitting: Family Medicine

## 2019-01-28 VITALS — BP 110/80 | HR 65 | Temp 97.7°F | Ht 71.0 in | Wt 194.6 lb

## 2019-01-28 DIAGNOSIS — E785 Hyperlipidemia, unspecified: Secondary | ICD-10-CM

## 2019-01-28 DIAGNOSIS — E049 Nontoxic goiter, unspecified: Secondary | ICD-10-CM

## 2019-01-28 DIAGNOSIS — Z86010 Personal history of colonic polyps: Secondary | ICD-10-CM

## 2019-01-28 DIAGNOSIS — Z0000 Encounter for general adult medical examination without abnormal findings: Secondary | ICD-10-CM | POA: Diagnosis not present

## 2019-01-28 DIAGNOSIS — R7989 Other specified abnormal findings of blood chemistry: Secondary | ICD-10-CM

## 2019-01-28 DIAGNOSIS — Z125 Encounter for screening for malignant neoplasm of prostate: Secondary | ICD-10-CM | POA: Diagnosis not present

## 2019-01-28 DIAGNOSIS — R945 Abnormal results of liver function studies: Secondary | ICD-10-CM

## 2019-01-28 DIAGNOSIS — E663 Overweight: Secondary | ICD-10-CM

## 2019-01-28 DIAGNOSIS — E119 Type 2 diabetes mellitus without complications: Secondary | ICD-10-CM | POA: Diagnosis not present

## 2019-01-28 DIAGNOSIS — E1169 Type 2 diabetes mellitus with other specified complication: Secondary | ICD-10-CM | POA: Diagnosis not present

## 2019-01-28 LAB — POC URINALSYSI DIPSTICK (AUTOMATED)
Bilirubin, UA: NEGATIVE
Blood, UA: NEGATIVE
Glucose, UA: POSITIVE — AB
Leukocytes, UA: NEGATIVE
Nitrite, UA: NEGATIVE
Protein, UA: NEGATIVE
Spec Grav, UA: 1.025 (ref 1.010–1.025)
Urobilinogen, UA: 0.2 E.U./dL
pH, UA: 6 (ref 5.0–8.0)

## 2019-01-28 LAB — MICROALBUMIN / CREATININE URINE RATIO
Creatinine,U: 104.9 mg/dL
Microalb Creat Ratio: 0.7 mg/g (ref 0.0–30.0)
Microalb, Ur: 0.8 mg/dL (ref 0.0–1.9)

## 2019-01-28 LAB — COMPREHENSIVE METABOLIC PANEL
ALT: 19 U/L (ref 0–53)
AST: 17 U/L (ref 0–37)
Albumin: 4.7 g/dL (ref 3.5–5.2)
Alkaline Phosphatase: 69 U/L (ref 39–117)
BUN: 12 mg/dL (ref 6–23)
CO2: 22 mEq/L (ref 19–32)
Calcium: 9.3 mg/dL (ref 8.4–10.5)
Chloride: 106 mEq/L (ref 96–112)
Creatinine, Ser: 0.88 mg/dL (ref 0.40–1.50)
GFR: 87.81 mL/min (ref >60.00)
Glucose, Bld: 92 mg/dL (ref 70–99)
Potassium: 3.7 mEq/L (ref 3.5–5.1)
Sodium: 139 mEq/L (ref 135–145)
Total Bilirubin: 0.6 mg/dL (ref 0.2–1.2)
Total Protein: 7.3 g/dL (ref 6.0–8.3)

## 2019-01-28 LAB — LIPID PANEL
Cholesterol: 203 mg/dL — ABNORMAL HIGH (ref 0–200)
HDL: 54.7 mg/dL (ref >39.00)
LDL Cholesterol: 122 mg/dL — ABNORMAL HIGH (ref 0–99)
NonHDL: 148.15
Total CHOL/HDL Ratio: 4
Triglycerides: 133 mg/dL (ref 0.0–149.0)
VLDL: 26.6 mg/dL (ref 0.0–40.0)

## 2019-01-28 LAB — TSH: TSH: 2.96 u[IU]/mL (ref 0.35–4.50)

## 2019-01-28 LAB — HEMOGLOBIN A1C: Hgb A1c MFr Bld: 6.2 % (ref 4.6–6.5)

## 2019-01-28 LAB — PSA: PSA: 0.63 ng/mL (ref 0.10–4.00)

## 2019-01-28 MED ORDER — JARDIANCE 10 MG PO TABS: 10.0000 mg | ORAL_TABLET | Freq: Every day | ORAL | 0 refills | Status: DC

## 2019-01-28 MED ORDER — GLIMEPIRIDE 4 MG PO TABS: 4.0000 mg | ORAL_TABLET | Freq: Every day | ORAL | 1 refills | Status: DC

## 2019-01-28 MED ORDER — ATORVASTATIN CALCIUM 20 MG PO TABS: ORAL_TABLET | ORAL | 0 refills | Status: DC

## 2019-01-28 MED ORDER — METFORMIN HCL 1000 MG PO TABS: 1000.0000 mg | ORAL_TABLET | Freq: Two times a day (BID) | ORAL | 1 refills | Status: DC

## 2019-01-28 NOTE — Addendum Note (Signed)
Addended by: Tomi Likens on: 01/28/2019 08:54 AM   Modules accepted: Orders

## 2019-01-28 NOTE — Patient Instructions (Addendum)
Health Maintenance Due  Topic Date Due  . HEMOGLOBIN A1C with labs today 01/22/2019  . FOOT EXAM Done today 01/25/2019  . URINE MICROALBUMIN orders placed 01/25/2019   4-6 month follow up. 6 months as long as a1c remains below 7.   Please stop by lab before you go If you do not have mychart- we will call you about results within 5 business days of [place] receiving them.  If you have mychart- we will send your results within 3 business days of [place] receiving them.  If abnormal or we want to clarify a result, we will call or mychart you to make sure you receive the message.  If you have questions or concerns or don't hear within 5-7 days, please send [place] a message or call us.

## 2019-01-28 NOTE — Progress Notes (Signed)
Phone: [phone]   Subjective:  Patient presents today for their annual physical. Chief complaint-noted.   See problem oriented charting- ROS- full  review of systems was completed and negative including No chest pain or shortness of breath. No headache or blurry vision.    The following were reviewed and entered/updated in epic: Past Medical History:  Diagnosis Date  . Allergy   . Diabetes mellitus   . Hyperlipidemia    Patient Active Problem List   Diagnosis Date Noted  . Well controlled diabetes mellitus ([place]) 09/04/2007    Priority: High  . Elevated LFTs 12/08/2014    Priority: Medium  . Goiter 10/15/2009    Priority: Medium  . Hyperlipidemia associated with type 2 diabetes mellitus ([place]) 09/04/2007    Priority: Medium  . Jock itch 07/30/2014    Priority: Low  . Allergic rhinitis 09/04/2007    Priority: Low  . History of adenomatous polyp of colon 04/20/2018   Past Surgical History:  Procedure Laterality Date  . COLONOSCOPY  10/26/2007   w/Brodie  . WISDOM TOOTH EXTRACTION      Family History  Problem Relation Age of Onset  . Diabetes Mother   . Dementia Mother        fall, hip fracture, she didnt improve  . Deep vein thrombosis Father   . Diabetes Paternal Uncle   . Colon cancer Neg Hx   . Esophageal cancer Neg Hx   . Stomach cancer Neg Hx   . Rectal cancer Neg Hx   . Colon polyps Neg Hx     Medications- reviewed and updated Current Outpatient Medications  Medication Sig Dispense Refill  . aspirin 325 MG tablet Take 325 mg by mouth daily.      Marland Kitchen atorvastatin (LIPITOR) 20 MG tablet TAKE 1 TABLET BY MOUTH 4  TIMES A WEEK 69 tablet 0  . empagliflozin (JARDIANCE) 10 MG TABS tablet Take 10 mg by mouth daily. 90 tablet 0  . glimepiride (AMARYL) 4 MG tablet Take 1 tablet (4 mg total) by mouth daily with breakfast. 120 tablet 1  . metFORMIN (GLUCOPHAGE) 1000 MG tablet Take 1 tablet (1,000 mg total) by mouth 2 (two) times daily with a meal. 240 tablet 1   No current facility-administered medications for this visit.     Allergies-reviewed and updated No Known Allergies  Social History   Social History Narrative   Married since 7. Daughter [employer] grad as of may 2020- . Retired age 77. Caring for dad and father in law.    Work at [employer] and [employer] long time.       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 Oropharynx normal Neck: no thyromegaly CV: RRR no murmurs rubs or gallops Lungs: CTAB no crackles, wheeze, rhonchi Abdomen: soft/nontender/nondistended/normal bowel sounds. No rebound or guarding.  Ext: no edema and 2+ PT pulses  Skin: warm, dry Neuro: grossly normal, moves all extremities, PERRLA  Diabetic Foot Exam - Simple   Simple Foot Form Diabetic Foot exam was performed with the following findings: Yes 01/28/2019  8:25 AM  Visual Inspection No deformities, no ulcerations, no other skin breakdown bilaterally: Yes Sensation  Testing Intact to touch and monofilament testing bilaterally: Yes Pulse Check Posterior Tibialis and Dorsalis pulse intact bilaterally: Yes Comments      Assessment and Plan  63 y.o. male presenting for annual physical.  Health Maintenance counseling: 1. Anticipatory guidance: Patient counseled regarding regular dental exams -q6 months, eye exams -yearly,  avoiding smoking and second hand smoke , limiting alcohol to 2 beverages per day - 5-10 per week.   2. Risk factor reduction:  Advised patient of need for regular exercise and diet rich and fruits and vegetables to reduce risk of heart attack and stroke. Exercise- had been doing walking- needs to restart. Diet-eating at home more/healthier diet.  Patient has done a phenomenal  job with weight loss down 16 pounds from last visit Wt Readings from Last 3 Encounters:  01/28/19 194 lb 9.6 oz (88.3 kg)  07/24/18 210 lb (95.3 kg)  04/16/18 210 lb (95.3 kg)  3. Immunizations/screenings/ancillary studies-fully up-to-date  Immunization History  Administered Date(s) Administered  . Hep A / Hep B 07/17/2015, 08/17/2015, 01/15/2016  . Influenza Whole 05/25/2009  . Influenza,inj,Quad PF,6+ Mos 04/23/2015, 05/17/2017  . Influenza-Unspecified 06/08/2014, 03/28/2016, 05/12/2018  . Pneumococcal Conjugate-13 09/29/2008  . Pneumococcal Polysaccharide-23 09/29/2008, 02/20/2014  . Td 08/25/2006  . Tdap 09/08/2016  . Zoster Recombinat (Shingrix) 01/17/2017, 03/20/2017  4. Prostate cancer screening- PSA trend has been low risk for prostate cancer update again today Lab Results  Component Value Date   PSA 0.83 01/24/2018   PSA 0.66 01/17/2017   PSA 0.55 01/11/2016   5. Colon cancer screening - last colonoscopy 2019 but had adenomatous colon polyps and will be due in 5 years for repeat 6. Skin cancer screening- no dermatologist. advised regular sunscreen use. Denies worrisome, changing, or new skin lesions.  7.  Former smoker-smokes cigars years ago (rare such as on beach trips)-we will plan on AAA screening at age 64   Status of chronic or acute concerns  DM - Taking Glimepiride 4 mg, Jardiance 10 mg, and Metformin 1000 mg BID. Eating home more due to covid 19. Doing some walking but has phased out Lab Results  Component Value Date   HGBA1C 6.3 (A) 07/24/2018  Hopefully stable-update A1c with labs today  Hyperlipidemia - Taking Atorvastatin 20 mg 4 days weekly.  Hopefully improved-update full lipid panel today Lab Results  Component Value Date   CHOL 238 (H) 01/24/2018   HDL 56.80 01/24/2018   LDLCALC 153 (H) 01/24/2018   LDLDIRECT 150.0 07/24/2018   TRIG 140.0 01/24/2018   CHOLHDL 4 01/24/2018    Elevated LFT -elevated LFTs in the past- normal more recently Lab  Results  Component Value Date   ALT 24 07/24/2018   AST 19 07/24/2018   ALKPHOS 63 07/24/2018   BILITOT 0.7 07/24/2018  Update LFTs today is now up to 4 times a week statin  Thyromegaly noted but stable-update TSH with labs  4-6 month follow up depending on a1c Lab/Order associations: fasting    ICD-10-CM   1. Preventative health care  Z00.00 Microalbumin / creatinine urine ratio    Hemoglobin A1c    Comp Met (CMET)    Lipid panel    TSH    PSA  2. Well controlled diabetes mellitus ([place])  E11.9 Microalbumin / creatinine urine ratio    Hemoglobin A1c    Comp Met (CMET)    Lipid panel  3. Goiter  E04.9 TSH  4. Elevated LFTs  R94.5   5. Hyperlipidemia associated with type 2 diabetes  mellitus ([place])  E11.69    E78.5   6. History of adenomatous polyp of colon  Z86.010   7. Screening for prostate cancer  Z12.5 PSA  8. Hyperlipidemia, unspecified hyperlipidemia type  E78.5 atorvastatin (LIPITOR) 20 MG tablet  9. Overweight  E66.3     Meds ordered this encounter  Medications  . metFORMIN (GLUCOPHAGE) 1000 MG tablet    Sig: Take 1 tablet (1,000 mg total) by mouth 2 (two) times daily with a meal.    Dispense:  240 tablet    Refill:  1  . empagliflozin (JARDIANCE) 10 MG TABS tablet    Sig: Take 10 mg by mouth daily.    Dispense:  90 tablet    Refill:  0  . glimepiride (AMARYL) 4 MG tablet    Sig: Take 1 tablet (4 mg total) by mouth daily with breakfast.    Dispense:  120 tablet    Refill:  1  . atorvastatin (LIPITOR) 20 MG tablet    Sig: TAKE 1 TABLET BY MOUTH 4  TIMES A WEEK    Dispense:  69 tablet    Refill:  0   Return precautions advised.  [name], MD

## 2019-01-29 ENCOUNTER — Other Ambulatory Visit: Payer: Self-pay | Admitting: Physical Therapy

## 2019-01-29 DIAGNOSIS — E785 Hyperlipidemia, unspecified: Secondary | ICD-10-CM

## 2019-01-29 MED ORDER — ATORVASTATIN CALCIUM 20 MG PO TABS: ORAL_TABLET | ORAL | 3 refills | Status: DC

## 2019-02-11 DIAGNOSIS — H52203 Unspecified astigmatism, bilateral: Secondary | ICD-10-CM

## 2019-02-11 DIAGNOSIS — H524 Presbyopia: Secondary | ICD-10-CM | POA: Insufficient documentation

## 2019-02-11 DIAGNOSIS — H269 Unspecified cataract: Secondary | ICD-10-CM

## 2019-02-11 HISTORY — DX: Presbyopia: H52.4

## 2019-02-11 HISTORY — DX: Presbyopia: H52.203

## 2019-02-11 HISTORY — DX: Unspecified cataract: H26.9

## 2019-02-11 LAB — HM DIABETES EYE EXAM

## 2019-02-18 ENCOUNTER — Other Ambulatory Visit: Payer: Self-pay | Admitting: Family Medicine

## 2019-02-21 ENCOUNTER — Encounter: Payer: Self-pay | Admitting: Physical Therapy

## 2019-02-21 ENCOUNTER — Encounter: Payer: Self-pay | Admitting: Family Medicine

## 2019-04-09 ENCOUNTER — Encounter: Payer: Self-pay | Admitting: Family Medicine

## 2019-05-29 ENCOUNTER — Other Ambulatory Visit: Payer: Self-pay | Admitting: Family Medicine

## 2019-08-02 ENCOUNTER — Other Ambulatory Visit: Payer: Self-pay

## 2019-08-04 ENCOUNTER — Ambulatory Visit: Payer: 59 | Attending: Internal Medicine

## 2019-08-04 DIAGNOSIS — Z23 Encounter for immunization: Secondary | ICD-10-CM | POA: Insufficient documentation

## 2019-08-04 NOTE — Progress Notes (Signed)
   Covid-19 Vaccination Clinic  Name:  PONCE GAMARRA    MRN: [id number] DOB: 14-Apr-1956  08/04/2019  Mr. Kozuch was observed post Covid-19 immunization for 15 minutes without incidence. He was provided with Vaccine Information Sheet and instruction to access the V-Safe system.   Mr. Osborne was instructed to call 911 with any severe reactions post vaccine: Marland Kitchen Difficulty breathing  . Swelling of your face and throat  . A fast heartbeat  . A bad rash all over your body  . Dizziness and weakness    Immunizations Administered    Name Date Dose VIS Date Route   Pfizer COVID-19 Vaccine 08/04/2019 11:49 AM 0.3 mL 07/05/2019 Intramuscular   Manufacturer: [employer]   Lot: [id number]   [place]: [id number]

## 2019-08-05 ENCOUNTER — Other Ambulatory Visit: Payer: Self-pay

## 2019-08-05 ENCOUNTER — Encounter: Payer: Self-pay | Admitting: Family Medicine

## 2019-08-05 ENCOUNTER — Ambulatory Visit (INDEPENDENT_AMBULATORY_CARE_PROVIDER_SITE_OTHER): Payer: 59 | Admitting: Family Medicine

## 2019-08-05 VITALS — BP 118/70 | HR 71 | Temp 97.9°F | Ht 71.0 in | Wt 197.2 lb

## 2019-08-05 DIAGNOSIS — E1169 Type 2 diabetes mellitus with other specified complication: Secondary | ICD-10-CM | POA: Diagnosis not present

## 2019-08-05 DIAGNOSIS — E119 Type 2 diabetes mellitus without complications: Secondary | ICD-10-CM

## 2019-08-05 DIAGNOSIS — E785 Hyperlipidemia, unspecified: Secondary | ICD-10-CM

## 2019-08-05 LAB — POCT GLYCOSYLATED HEMOGLOBIN (HGB A1C): Hemoglobin A1C: 5.5 % (ref 4.0–5.6)

## 2019-08-05 LAB — COMPREHENSIVE METABOLIC PANEL WITH GFR
ALT: 20 U/L (ref 0–53)
AST: 16 U/L (ref 0–37)
Albumin: 4.5 g/dL (ref 3.5–5.2)
Alkaline Phosphatase: 66 U/L (ref 39–117)
BUN: 11 mg/dL (ref 6–23)
CO2: 24 meq/L (ref 19–32)
Calcium: 9.8 mg/dL (ref 8.4–10.5)
Chloride: 107 meq/L (ref 96–112)
Creatinine, Ser: 0.97 mg/dL (ref 0.40–1.50)
GFR: 78.34 mL/min
Glucose, Bld: 109 mg/dL — ABNORMAL HIGH (ref 70–99)
Potassium: 4.3 meq/L (ref 3.5–5.1)
Sodium: 140 meq/L (ref 135–145)
Total Bilirubin: 0.5 mg/dL (ref 0.2–1.2)
Total Protein: 7.2 g/dL (ref 6.0–8.3)

## 2019-08-05 LAB — LDL CHOLESTEROL, DIRECT: Direct LDL: 155 mg/dL

## 2019-08-05 MED ORDER — GLIMEPIRIDE 2 MG PO TABS: 2.0000 mg | ORAL_TABLET | Freq: Every day | ORAL | 1 refills | Status: DC

## 2019-08-05 NOTE — Assessment & Plan Note (Signed)
very well controlled diabetes on POC at 5.5. No low blood sugars but to help avoid we opted to reduce glimepiride to 2 mg. Continue other medicines. Follow up in 6 months. Continue metformin 1 g BID and jardiance 10 mg

## 2019-08-05 NOTE — Patient Instructions (Addendum)
Health Maintenance Due  Topic Date Due  . HEMOGLOBIN A1C done in office today  07/31/2019   very well controlled diabetes. No low blood sugars but to help avoid we opted to reduce glimepiride to 2 mg. Continue other medicines. Follow up in 6 months.   You did a great job losing weight through last visit- only mild increase despite the holidays so good job there as well. Lets try to target at least 5 lbs down by next visit and goal exercising at least 20 minutes a day 3x a week with longterm goal 150 minutes per week   Please stop by lab before you go If you do not have mychart- we will call you about results within 5 business days of [place] receiving them.  If you have mychart- we will send your results within 3 business days of [place] receiving them.  If abnormal or we want to clarify a result, we will call or mychart you to make sure you receive the message.  If you have questions or concerns or don't hear within 5-7 days, please send [place] a message or call us.     Recommended follow up: Return in about 6 months (around 02/02/2020) for CPE.

## 2019-08-05 NOTE — Progress Notes (Signed)
Phone [phone] In person visit   Subjective:   Juan Bennett is a 64 y.o. year old very pleasant male patient who presents for/with See problem oriented charting Chief Complaint  Patient presents with  . Follow-up    ROS- No chest pain or shortness of breath. No headache or blurry vision.    This visit occurred during the SARS-CoV-2 public health emergency.  Safety protocols were in place, including screening questions prior to the visit, additional usage of staff PPE, and extensive cleaning of exam room while observing appropriate contact time as indicated for disinfecting solutions.   Past Medical History-  Patient Active Problem List   Diagnosis Date Noted  . Well controlled diabetes mellitus ([place]) 09/04/2007    Priority: High  . Elevated LFTs 12/08/2014    Priority: Medium  . Goiter 10/15/2009    Priority: Medium  . Hyperlipidemia associated with type 2 diabetes mellitus ([place]) 09/04/2007    Priority: Medium  . Jock itch 07/30/2014    Priority: Low  . Allergic rhinitis 09/04/2007    Priority: Low  . Cataracts, both eyes 02/11/2019  . Astigmatism with presbyopia, bilateral 02/11/2019  . History of adenomatous polyp of colon 04/20/2018    Medications- reviewed and updated Current Outpatient Medications  Medication Sig Dispense Refill  . aspirin 325 MG tablet Take 325 mg by mouth daily.      Marland Kitchen atorvastatin (LIPITOR) 20 MG tablet TAKE 1 TABLET BY MOUTH 5  TIMES A WEEK 75 tablet 3  . glimepiride (AMARYL) 2 MG tablet Take 1 tablet (2 mg total) by mouth daily with breakfast. 90 tablet 1  . JARDIANCE 10 MG TABS tablet TAKE 1 TABLET BY MOUTH DAILY 90 tablet 0  . metFORMIN (GLUCOPHAGE) 1000 MG tablet Take 1 tablet (1,000 mg total) by mouth 2 (two) times daily with a meal. 240 tablet 1   No current facility-administered medications for this visit.     Objective:  BP 118/70   Pulse 71   Temp 97.9 F (36.6 C)   Ht 5\' 11"  (1.803 m)   Wt 197 lb 3.2 oz (89.4 kg)    SpO2 97%   BMI 27.50 kg/m  Gen: NAD, resting comfortably CV: RRR no murmurs rubs or gallops Lungs: CTAB no crackles, wheeze, rhonchi Ext: no edema Skin: warm, dry Neuro: grossly normal, moves all extremities     Assessment and Plan   # social update- dad had a fall but has recovered. He is spending more time with him. He is being very careful about covid.  He even got his first vaccine when helping Dad get his!   # Diabetes S: Compliant with Glimepiride 4 mg, Jardiance 10 mg, and Metformin 1000 mg BID. POC a1c done in office today.  CBGs- no low blood sugars Exercise and diet- limited by caregiver. Up only 3 lbs through holidays.  Lab Results  Component Value Date   HGBA1C 5.5 POC 08/05/2019   HGBA1C 6.2 01/28/2019   HGBA1C 6.3 (A) 07/24/2018   A/P: very well controlled diabetes on POC at 5.5. No low blood sugars but to help avoid we opted to reduce glimepiride to 2 mg. Continue other medicines. Follow up in 6 months.Continue metformin 1 g BID and jardiance 10 mg   # Hyperlipidemia  # elevated LFT history S:Currently on atorvastatin 20 mg to 5 days a week- no issues Lab Results  Component Value Date   CHOL 203 (H) 01/28/2019   HDL 54.70 01/28/2019   LDLCALC 122 (H)  01/28/2019   LDLDIRECT 150.0 07/24/2018   TRIG 133.0 01/28/2019   CHOLHDL 4 01/28/2019   Lab Results  Component Value Date   ALT 19 01/28/2019   AST 17 01/28/2019   ALKPHOS 69 01/28/2019   BILITOT 0.6 01/28/2019   A/P:  poor control last check with LDL at 122 . Update LDL and if remains above 100 go to 6 days a week- have to monitor LFTs closely due to history elevation- looked good last visit  # thyromegaly- update tsh next visit Lab Results  Component Value Date   TSH 2.96 01/28/2019   Recommended follow up: Return in about 6 months (around 02/02/2020) for CPE, physical or sooner if needed. Future Appointments  Date Time Provider [place]  08/24/2019 10:30 AM [facility]  [facility]   Lab/Order associations:   ICD-10-CM   1. Hyperlipidemia associated with type 2 diabetes mellitus ([facility])  E11.69 POCT HgB A1C   E78.5 Direct LDL    Comprehensive metabolic panel  2. Hyperlipidemia, unspecified hyperlipidemia type  E78.5    Meds ordered this encounter  Medications  . glimepiride (AMARYL) 2 MG tablet    Sig: Take 1 tablet (2 mg total) by mouth daily with breakfast.    Dispense:  90 tablet    Refill:  1   Return precautions advised.  [name], MD

## 2019-08-06 ENCOUNTER — Other Ambulatory Visit: Payer: Self-pay

## 2019-08-06 DIAGNOSIS — E785 Hyperlipidemia, unspecified: Secondary | ICD-10-CM

## 2019-08-06 MED ORDER — ATORVASTATIN CALCIUM 20 MG PO TABS: ORAL_TABLET | ORAL | 3 refills | Status: DC

## 2019-08-24 ENCOUNTER — Ambulatory Visit: Payer: Self-pay | Attending: Internal Medicine

## 2019-08-24 DIAGNOSIS — Z23 Encounter for immunization: Secondary | ICD-10-CM | POA: Insufficient documentation

## 2019-08-24 NOTE — Progress Notes (Signed)
   Covid-19 Vaccination Clinic  Name:  Juan Bennett    MRN: [id number] DOB: [birth year]  08/24/2019  [name] was observed post Covid-19 immunization for 15 minutes without incidence. He was provided with Vaccine Information Sheet and instruction to access the V-Safe system.   [name] was instructed to call 911 with any severe reactions post vaccine: Marland Kitchen Difficulty breathing  . Swelling of your face and throat  . A fast heartbeat  . A bad rash all over your body  . Dizziness and weakness    Immunizations Administered    Name Date Dose VIS Date Route   Pfizer COVID-19 Vaccine 08/24/2019 10:19 AM 0.3 mL 07/05/2019 Intramuscular   Manufacturer: [employer]   Lot: [id number]   [place]: [id number]

## 2019-09-10 ENCOUNTER — Other Ambulatory Visit: Payer: Self-pay

## 2019-09-10 MED ORDER — JARDIANCE 10 MG PO TABS: 10.0000 mg | ORAL_TABLET | Freq: Every day | ORAL | 0 refills | Status: DC

## 2019-09-30 ENCOUNTER — Other Ambulatory Visit: Payer: Self-pay

## 2019-09-30 ENCOUNTER — Other Ambulatory Visit (INDEPENDENT_AMBULATORY_CARE_PROVIDER_SITE_OTHER): Payer: 59

## 2019-09-30 DIAGNOSIS — E785 Hyperlipidemia, unspecified: Secondary | ICD-10-CM

## 2019-09-30 LAB — HEPATIC FUNCTION PANEL
ALT: 18 U/L (ref 0–53)
AST: 16 U/L (ref 0–37)
Albumin: 4.3 g/dL (ref 3.5–5.2)
Alkaline Phosphatase: 61 U/L (ref 39–117)
Bilirubin, Direct: 0.1 mg/dL (ref 0.0–0.3)
Total Bilirubin: 0.5 mg/dL (ref 0.2–1.2)
Total Protein: 7 g/dL (ref 6.0–8.3)

## 2019-09-30 LAB — LDL CHOLESTEROL, DIRECT: Direct LDL: 124 mg/dL

## 2019-10-10 ENCOUNTER — Other Ambulatory Visit: Payer: Self-pay

## 2019-10-10 ENCOUNTER — Encounter: Payer: Self-pay | Admitting: Internal Medicine

## 2019-10-10 ENCOUNTER — Ambulatory Visit: Payer: 59 | Admitting: Internal Medicine

## 2019-10-10 DIAGNOSIS — E785 Hyperlipidemia, unspecified: Secondary | ICD-10-CM

## 2019-10-10 DIAGNOSIS — E119 Type 2 diabetes mellitus without complications: Secondary | ICD-10-CM

## 2019-10-10 DIAGNOSIS — E1169 Type 2 diabetes mellitus with other specified complication: Secondary | ICD-10-CM

## 2019-10-10 DIAGNOSIS — M25511 Pain in right shoulder: Secondary | ICD-10-CM | POA: Diagnosis not present

## 2019-10-10 MED ORDER — MELOXICAM 15 MG PO TABS: 15.0000 mg | ORAL_TABLET | Freq: Every day | ORAL | 0 refills | Status: DC

## 2019-10-10 MED ORDER — CYCLOBENZAPRINE HCL 5 MG PO TABS: 5.0000 mg | ORAL_TABLET | Freq: Three times a day (TID) | ORAL | 1 refills | Status: DC | PRN

## 2019-10-10 NOTE — Assessment & Plan Note (Signed)
stable overall by history and exam, recent data reviewed with pt, and pt to continue medical treatment as before,  to f/u any worsening symptoms or concerns  

## 2019-10-10 NOTE — Patient Instructions (Signed)
Please take all new medication as prescribed - for mobic, muscle relaxer

## 2019-10-10 NOTE — Progress Notes (Signed)
Subjective:    Patient ID: Juan Bennett, male    DOB: 10-29-1955, 64 y.o.   MRN: [id number]  HPI  Here with c/o right shoulder pain x 2 days after fell backwards into the bed of the truck while unloading mulch, did not have pain immediately but seemed more the the next morning with stiffness, pain, mild to mod, with reduced ROM, hard to raise overhead, alleve did not help, otC pain patch helped some, did sleep ok last night after made appt and today actually feels improved again. Pt denies chest pain, increased sob or doe, wheezing, orthopnea, PND, increased LE swelling, palpitations, dizziness or syncope.  Pt denies new neurological symptoms such as new headache, or facial or extremity weakness or numbness   Pt denies polydipsia, polyuria  BP has been < 140/90 at home Past Medical History:  Diagnosis Date  . Allergy   . Astigmatism with presbyopia, bilateral 02/11/2019  . Cataracts, both eyes 02/11/2019  . Diabetes mellitus   . Hyperlipidemia    Past Surgical History:  Procedure Laterality Date  . COLONOSCOPY  10/26/2007   w/Brodie  . WISDOM TOOTH EXTRACTION      reports that he has quit smoking. His smoking use included cigarettes and cigars. He has never used smokeless tobacco. He reports current alcohol use of about 4.0 - 6.0 standard drinks of alcohol per week. He reports that he does not use drugs. family history includes Deep vein thrombosis in his father; Dementia in his mother; Diabetes in his mother and paternal uncle. No Known Allergies Current Outpatient Medications on File Prior to Visit  Medication Sig Dispense Refill  . aspirin 325 MG tablet Take 325 mg by mouth daily.      Marland Kitchen atorvastatin (LIPITOR) 20 MG tablet One tab daily 90 tablet 3  . empagliflozin (JARDIANCE) 10 MG TABS tablet Take 10 mg by mouth daily. 90 tablet 0  . glimepiride (AMARYL) 2 MG tablet Take 1 tablet (2 mg total) by mouth daily with breakfast. 90 tablet 1  . metFORMIN (GLUCOPHAGE) 1000 MG tablet  Take 1 tablet (1,000 mg total) by mouth 2 (two) times daily with a meal. 240 tablet 1   No current facility-administered medications on file prior to visit.   Review of Systems All otherwise neg per pt     Objective:   Physical Exam BP (!) 144/80   Pulse 72   Temp 98.2 F (36.8 C)   Ht 5\' 11"  (1.803 m)   Wt 193 lb (87.5 kg)   SpO2 99%   BMI 26.92 kg/m  VS noted,  Constitutional: Pt appears in NAD HENT: Head: NCAT.  Right Ear: External ear normal.  Left Ear: External ear normal.  Eyes: . Pupils are equal, round, and reactive to light. Conjunctivae and EOM are normal Nose: without d/c or deformity Neck: Neck supple. Gross normal ROM Cardiovascular: Normal rate and regular rhythm.   Pulmonary/Chest: Effort normal and breath sounds without rales or wheezing.  Right shoulder NT, no swelling, rash. And today has near normal ROM with mild discomfort only to abduction and forward elevation Neurological: Pt is alert. At baseline orientation, motor grossly intact Skin: Skin is warm. No rashes, other new lesions, no LE edema Psychiatric: Pt behavior is normal without agitation  All otherwise neg per pt Lab Results  Component Value Date   WBC 4.9 01/24/2018   HGB 16.2 01/24/2018   HCT 47.9 01/24/2018   PLT 195.0 01/24/2018   GLUCOSE 109 (H) 08/05/2019  CHOL 203 (H) 01/28/2019   TRIG 133.0 01/28/2019   HDL 54.70 01/28/2019   LDLDIRECT 124.0 09/30/2019   LDLCALC 122 (H) 01/28/2019   ALT 18 09/30/2019   AST 16 09/30/2019   NA 140 08/05/2019   K 4.3 08/05/2019   CL 107 08/05/2019   CREATININE 0.97 08/05/2019   BUN 11 08/05/2019   CO2 24 08/05/2019   TSH 2.96 01/28/2019   PSA 0.63 01/28/2019   HGBA1C 5.5 08/05/2019   MICROALBUR 0.8 01/28/2019      Assessment & Plan:

## 2019-10-10 NOTE — Assessment & Plan Note (Signed)
C/w msk strain, improving, for nsaid prn, muscle relaxer prn,  to f/u any worsening symptoms or concerns  I spent 31 minutes in preparing to see the patient by review of recent labs, imaging and procedures, obtaining and reviewing separately obtained history, communicating with the patient and family or caregiver, ordering medications, tests or procedures, and documenting clinical information in the EHR including the differential Dx, treatment, and any further evaluation and other management of right shoudler pain, DM, HLD

## 2019-11-02 ENCOUNTER — Other Ambulatory Visit: Payer: Self-pay | Admitting: Family Medicine

## 2019-12-07 ENCOUNTER — Other Ambulatory Visit: Payer: Self-pay | Admitting: Family Medicine

## 2019-12-30 ENCOUNTER — Other Ambulatory Visit: Payer: Self-pay | Admitting: Family Medicine

## 2020-02-03 ENCOUNTER — Encounter: Payer: Self-pay | Admitting: Family Medicine

## 2020-02-03 NOTE — Patient Instructions (Addendum)
Health Maintenance Due  Topic Date Due   FOOT EXAM done today in office.  01/28/2020   -could try tums before brush teeth or even a pepcid with dinner to see if that makes a difference with belching  -On exam signs of impingement with rotator cuff vs. Bursitis. We discussed home exercises 3x a week for a month and then once a week. If not improving in 1-2 months refer to sports medicine. If pain increased more than 1-2/10 move to next exercise and can retrial next exercise  Please stop by lab before you go If you have mychart- we will send your results within 3 business days of [place] receiving them.  If you do not have mychart- we will call you about results within 5 business days of [place] receiving them.

## 2020-02-03 NOTE — Progress Notes (Signed)
Phone: [phone]   Subjective:  Patient presents today for their annual physical. Chief complaint-noted.   See problem oriented charting- Review of Systems  Constitutional: Negative for chills, fever and weight loss.  HENT: Positive for sinus pain.        For last few months. Tried otc medications has not improved much.   Eyes: Negative for blurred vision, double vision and photophobia.  Respiratory: Negative for cough, shortness of breath and wheezing.   Cardiovascular: Negative for chest pain, palpitations and leg swelling.  Gastrointestinal: Negative for constipation, heartburn and nausea.  Genitourinary: Negative for dysuria, frequency and urgency.  Musculoskeletal: Positive for joint pain. Negative for myalgias and neck pain.       Shoulder pain after fall right. Much better now. Only mild tenderness.   Skin: Negative for itching and rash.  Neurological: Negative for dizziness, weakness and headaches.  Endo/Heme/Allergies: Does not bruise/bleed easily.  Psychiatric/Behavioral: Negative for depression and suicidal ideas. The patient does not have insomnia.    The following were reviewed and entered/updated in epic: Past Medical History:  Diagnosis Date  . Allergy   . Astigmatism with presbyopia, bilateral 02/11/2019  . Cataracts, both eyes 02/11/2019  . Diabetes mellitus   . Hyperlipidemia    Patient Active Problem List   Diagnosis Date Noted  . Well controlled diabetes mellitus ([place]) 09/04/2007    Priority: High  . History of adenomatous polyp of colon 04/20/2018    Priority: Medium  . Elevated LFTs 12/08/2014    Priority: Medium  . Goiter 10/15/2009    Priority: Medium  . Hyperlipidemia associated with type 2 diabetes mellitus ([place]) 09/04/2007    Priority: Medium  . Cataracts, both eyes 02/11/2019    Priority: Low  . Astigmatism with presbyopia, bilateral 02/11/2019    Priority: Low  . Jock itch 07/30/2014    Priority: Low  . Allergic rhinitis 09/04/2007     Priority: Low  . Right shoulder pain 10/10/2019   Past Surgical History:  Procedure Laterality Date  . COLONOSCOPY  10/26/2007   w/Brodie  . WISDOM TOOTH EXTRACTION      Family History  Problem Relation Age of Onset  . Diabetes Mother   . Dementia Mother        fall, hip fracture, she didnt improve  . Deep vein thrombosis Father   . Diabetes Paternal Uncle   . Colon cancer Neg Hx   . Esophageal cancer Neg Hx   . Stomach cancer Neg Hx   . Rectal cancer Neg Hx   . Colon polyps Neg Hx     Medications- reviewed and updated Current Outpatient Medications  Medication Sig Dispense Refill  . aspirin 325 MG tablet Take 325 mg by mouth daily.      Marland Kitchen atorvastatin (LIPITOR) 20 MG tablet One tab daily 90 tablet 3  . glimepiride (AMARYL) 2 MG tablet TAKE 1 TABLET BY MOUTH  DAILY WITH BREAKFAST 120 tablet 2  . JARDIANCE 10 MG TABS tablet TAKE 1 TABLET BY MOUTH DAILY 90 tablet 0  . metFORMIN (GLUCOPHAGE) 1000 MG tablet TAKE 1 TABLET BY MOUTH  TWICE DAILY WITH A MEAL 240 tablet 2   No current facility-administered medications for this visit.    Allergies-reviewed and updated No Known Allergies  Social History   Social History Narrative   Married since 27. Daughter [employer] grad as of may 2020- . Retired age 46. Caring for dad and father in law.    Work at [employer] and [employer]  long time.       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 Oropharynx normal Neck: no thyromegaly CV: RRR no murmurs rubs or gallops Lungs: CTAB no crackles, wheeze, rhonchi Abdomen: soft/nontender/nondistended/normal bowel sounds. No rebound or guarding.  Ext: no edema Skin: warm, dry Neuro: grossly normal, moves all extremities,  PERRLA MSK: positive Neer and Hawkin test. Pain with push off from the back.    Diabetic Foot Exam - Simple   Simple Foot Form Diabetic Foot exam was performed with the following findings: Yes 02/05/2020 10:44 AM  Visual Inspection No deformities, no ulcerations, no other skin breakdown bilaterally: Yes Sensation Testing Intact to touch and monofilament testing bilaterally: Yes Pulse Check Posterior Tibialis and Dorsalis pulse intact bilaterally: Yes Comments       Assessment and Plan  64 y.o. male presenting for annual physical.  Health Maintenance counseling: 1. Anticipatory guidance: Patient counseled regarding regular dental exams q6 months, eye exams yearly has appointment soon,  avoiding smoking and second hand smoke , limiting alcohol to 2 beverages per day . 2 times a week on average 3 beers.   2. Risk factor reduction:  Advised patient of need for regular exercise and diet rich and fruits and vegetables to reduce risk of heart attack and stroke. Exercise- walks on average 2 times a week for about 20 minutes- encouraged to increase and consider placing on calendar . Diet-does not have any special diet but feels like he eats healthy.   Wt Readings from Last 3 Encounters:  02/05/20 193 lb 12.8 oz (87.9 kg)  10/10/19 193 lb (87.5 kg)  08/05/19 197 lb 3.2 oz (89.4 kg)   3. Immunizations/screenings/ancillary studies- up to date Immunization History  Administered Date(s) Administered  . Hep A / Hep B 07/17/2015, 08/17/2015, 01/15/2016  . Influenza Whole 05/25/2009  . Influenza,inj,Quad PF,6+ Mos 04/23/2015, 05/17/2017, 05/12/2018  . Influenza-Unspecified 06/08/2014, 03/28/2016, 05/12/2018, 04/09/2019  . PFIZER SARS-COV-2 Vaccination 08/04/2019, 08/24/2019  . Pneumococcal Conjugate-13 09/29/2008  . Pneumococcal Polysaccharide-23 09/29/2008, 02/20/2014  . Td 08/25/2006  . Tdap 09/08/2016  . Zoster Recombinat (Shingrix) 01/17/2017, 03/20/2017  4. Prostate cancer screening- low  risk prior PSA trend- update PSa with boodwork today  Lab Results  Component Value Date   PSA 0.63 01/28/2019   PSA 0.83 01/24/2018   PSA 0.66 01/17/2017   5. Colon cancer screening - next due 2024. 04/16/18 with 5 year follow up due to polyp history.  6. Skin cancer screening- advised regular sunscreen use. Denies worrisome, changing, or new skin lesions. Not followed by dermatology.  7. never smoker 8. STD screening - Declines   Status of chronic or acute concerns   # Diabetes S: Medication:Compliant with Glimepiride 2 mg, Jardiance 10 mg, and Metformin 1000 mg BID. CBGs- does not check at home  Exercise and diet- walking twice a week. Working on limited carbs.  Lab Results  Component Value Date   HGBA1C POC 5.5 08/05/2019   HGBA1C 6.2 01/28/2019   HGBA1C 6.3 (A) 07/24/2018   A/P: hopefully still well controlled- update a1c today.  -if a1c isbelow 6 on phlebotomy- consider stopping glimepiride  #hyperlipidemia/elevated LFTs S:  Medication:Currently on atorvastatin 20 mg to 5 days a week- no issues Lab Results  Component Value Date   CHOL 203 (H) 01/28/2019   HDL 54.70 01/28/2019   LDLCALC 122 (H) 01/28/2019   LDLDIRECT 124.0 09/30/2019   TRIG 133.0 01/28/2019   CHOLHDL 4 01/28/2019   A/P: elevated LFts in past with cholesterol medicine- has done well on present dose even though #s slightly high. Likely continue current dose Lab Results  Component Value Date   ALT 18 09/30/2019   AST 16 09/30/2019   ALKPHOS 61 09/30/2019   BILITOT 0.5 09/30/2019   # Belching- seems to be worse at night particularlly when lays down. Increased frequency. No pain. No extra flatulence. No recent change in diet. Denies reflux symptoms. Has not tried Tums with this but has on hand.   -could try tums before brush teeth or even a pepcid with dinner to see if that makes a difference with belching  # back pain- intermittent nagging back pain- no recent flare ups.  #right shoulder pain-  Fell  backwards loading mulch into a pick up truck- nagging mild pain but much improved.   On exam signs of impingement with rotator cuff vs. Bursitis. We discussed home exercises 3x a week for a month and then once a week. If not improving in 1-2 months refer to sports medicine. If pain increased more than 1-2/10 move to next exercise and can retrial next exercise If either orthopedic issues worsen he will let us know and can consider sports medicine referral   #goiter in past- check TSH. Has had prior ultrasound and no intervention recommended  Recommended follow up: Return in about 6 months (around 08/07/2020) for follow up- or sooner if needed.  Lab/Order associations: fasting   ICD-10-CM   1. Preventative health care  Z00.00 Microalbumin / creatinine urine ratio    Hemoglobin A1c    CBC with Differential/Platelet    Comprehensive metabolic panel    Lipid panel    PSA    TSH  2. Hyperlipidemia associated with type 2 diabetes mellitus (HCC)  E11.69 CBC with Differential/Platelet   E78.5 Comprehensive metabolic panel    Lipid panel  3. Well controlled diabetes mellitus ([place])  E11.9 Microalbumin / creatinine urine ratio    Hemoglobin A1c  4. Screening for prostate cancer  Z12.5 PSA  5. Goiter  E04.9 TSH   Return precautions advised.  [name], MD

## 2020-02-05 ENCOUNTER — Ambulatory Visit (INDEPENDENT_AMBULATORY_CARE_PROVIDER_SITE_OTHER): Payer: 59 | Admitting: Family Medicine

## 2020-02-05 ENCOUNTER — Other Ambulatory Visit: Payer: Self-pay

## 2020-02-05 ENCOUNTER — Encounter: Payer: Self-pay | Admitting: Family Medicine

## 2020-02-05 VITALS — BP 118/62 | HR 67 | Temp 97.9°F | Ht 71.0 in | Wt 193.8 lb

## 2020-02-05 DIAGNOSIS — E785 Hyperlipidemia, unspecified: Secondary | ICD-10-CM | POA: Diagnosis not present

## 2020-02-05 DIAGNOSIS — E049 Nontoxic goiter, unspecified: Secondary | ICD-10-CM

## 2020-02-05 DIAGNOSIS — Z125 Encounter for screening for malignant neoplasm of prostate: Secondary | ICD-10-CM

## 2020-02-05 DIAGNOSIS — Z0000 Encounter for general adult medical examination without abnormal findings: Secondary | ICD-10-CM

## 2020-02-05 DIAGNOSIS — E119 Type 2 diabetes mellitus without complications: Secondary | ICD-10-CM | POA: Diagnosis not present

## 2020-02-05 DIAGNOSIS — E1169 Type 2 diabetes mellitus with other specified complication: Secondary | ICD-10-CM

## 2020-02-05 LAB — COMPREHENSIVE METABOLIC PANEL
ALT: 17 U/L (ref 0–53)
AST: 15 U/L (ref 0–37)
Albumin: 4.6 g/dL (ref 3.5–5.2)
Alkaline Phosphatase: 60 U/L (ref 39–117)
BUN: 14 mg/dL (ref 6–23)
CO2: 28 mEq/L (ref 19–32)
Calcium: 9.6 mg/dL (ref 8.4–10.5)
Chloride: 104 mEq/L (ref 96–112)
Creatinine, Ser: 0.9 mg/dL (ref 0.40–1.50)
GFR: 85.28 mL/min (ref >60.00)
Glucose, Bld: 104 mg/dL — ABNORMAL HIGH (ref 70–99)
Potassium: 4.2 mEq/L (ref 3.5–5.1)
Sodium: 138 mEq/L (ref 135–145)
Total Bilirubin: 0.5 mg/dL (ref 0.2–1.2)
Total Protein: 6.9 g/dL (ref 6.0–8.3)

## 2020-02-05 LAB — CBC WITH DIFFERENTIAL/PLATELET
Basophils Absolute: 0.1 10*3/uL (ref 0.0–0.1)
Basophils Relative: 1.5 % (ref 0.0–3.0)
Eosinophils Absolute: 0.1 10*3/uL (ref 0.0–0.7)
Eosinophils Relative: 2.4 % (ref 0.0–5.0)
HCT: 46.1 % (ref 39.0–52.0)
Hemoglobin: 15.3 g/dL (ref 13.0–17.0)
Lymphocytes Relative: 29.8 % (ref 12.0–46.0)
Lymphs Abs: 1.4 10*3/uL (ref 0.7–4.0)
MCHC: 33.3 g/dL (ref 30.0–36.0)
MCV: 90.5 fl (ref 78.0–100.0)
Monocytes Absolute: 0.4 10*3/uL (ref 0.1–1.0)
Monocytes Relative: 9.3 % (ref 3.0–12.0)
Neutro Abs: 2.7 10*3/uL (ref 1.4–7.7)
Neutrophils Relative %: 57 % (ref 43.0–77.0)
Platelets: 180 10*3/uL (ref 150.0–400.0)
RBC: 5.09 Mil/uL (ref 4.22–5.81)
RDW: 13.4 % (ref 11.5–15.5)
WBC: 4.7 10*3/uL (ref 4.0–10.5)

## 2020-02-05 LAB — LIPID PANEL
Cholesterol: 196 mg/dL (ref 0–200)
HDL: 61.6 mg/dL (ref >39.00)
LDL Cholesterol: 119 mg/dL — ABNORMAL HIGH (ref 0–99)
NonHDL: 134.09
Total CHOL/HDL Ratio: 3
Triglycerides: 77 mg/dL (ref 0.0–149.0)
VLDL: 15.4 mg/dL (ref 0.0–40.0)

## 2020-02-05 LAB — PSA: PSA: 0.54 ng/mL (ref 0.10–4.00)

## 2020-02-05 LAB — TSH: TSH: 1.84 u[IU]/mL (ref 0.35–4.50)

## 2020-02-05 LAB — HEMOGLOBIN A1C: Hgb A1c MFr Bld: 6.4 % (ref 4.6–6.5)

## 2020-02-05 LAB — MICROALBUMIN / CREATININE URINE RATIO
Creatinine,U: 75.3 mg/dL
Microalb Creat Ratio: 0.9 mg/g (ref 0.0–30.0)
Microalb, Ur: <0.7 mg/dL (ref 0.0–1.9)

## 2020-02-17 LAB — HM DIABETES EYE EXAM

## 2020-03-13 ENCOUNTER — Other Ambulatory Visit: Payer: Self-pay | Admitting: Family Medicine

## 2020-05-24 ENCOUNTER — Other Ambulatory Visit: Payer: Self-pay | Admitting: Family Medicine

## 2020-05-24 DIAGNOSIS — E785 Hyperlipidemia, unspecified: Secondary | ICD-10-CM

## 2020-06-11 ENCOUNTER — Other Ambulatory Visit: Payer: Self-pay | Admitting: Family Medicine

## 2020-07-27 ENCOUNTER — Other Ambulatory Visit: Payer: 59

## 2020-07-27 DIAGNOSIS — Z20822 Contact with and (suspected) exposure to covid-19: Secondary | ICD-10-CM

## 2020-07-28 ENCOUNTER — Encounter: Payer: Self-pay | Admitting: Family Medicine

## 2020-07-28 LAB — SARS-COV-2, NAA 2 DAY TAT

## 2020-07-28 LAB — NOVEL CORONAVIRUS, NAA: SARS-CoV-2, NAA: DETECTED — AB

## 2020-07-29 ENCOUNTER — Telehealth (INDEPENDENT_AMBULATORY_CARE_PROVIDER_SITE_OTHER): Payer: 59 | Admitting: Family Medicine

## 2020-07-29 ENCOUNTER — Other Ambulatory Visit: Payer: Self-pay

## 2020-07-29 ENCOUNTER — Encounter: Payer: Self-pay | Admitting: Family Medicine

## 2020-07-29 DIAGNOSIS — U071 COVID-19: Secondary | ICD-10-CM | POA: Diagnosis not present

## 2020-07-29 NOTE — Telephone Encounter (Signed)
Scheduled pt for today at 12pm

## 2020-07-29 NOTE — Progress Notes (Signed)
Phone [phone] Virtual visit via Video note   Subjective:  Chief complaint: Chief Complaint  Patient presents with  . Covid Positive   This visit type was conducted due to national recommendations for restrictions regarding the COVID-19 Pandemic (e.g. social distancing).  This format is felt to be most appropriate for this patient at this time balancing risks to patient and risks to population by having him in for in person visit.  No physical exam was performed (except for noted visual exam or audio findings with Telehealth visits).    Our team/I connected with Juan Bennett at 11:40 AM EST by a video enabled telemedicine application ([email or web address] or caregility through epic) and verified that I am speaking with the correct person using two identifiers.  Location patient: [place] Location provider: [facility], office Persons participating in the virtual visit:  patient  Our team/I discussed the limitations of evaluation and management by telemedicine and the availability of in person appointments. In light of current covid-19 pandemic, patient also understands that we are trying to protect them by minimizing in office contact if at all possible.  The patient expressed consent for telemedicine visit and agreed to proceed. Patient understands insurance will be billed.   Past Medical History-  Patient Active Problem List   Diagnosis Date Noted  . Well controlled diabetes mellitus (HCC) 09/04/2007    Priority: High  . History of adenomatous polyp of colon 04/20/2018    Priority: Medium  . Elevated LFTs 12/08/2014    Priority: Medium  . Goiter 10/15/2009    Priority: Medium  . Hyperlipidemia associated with type 2 diabetes mellitus (HCC) 09/04/2007    Priority: Medium  . Cataracts, both eyes 02/11/2019    Priority: Low  . Astigmatism with presbyopia, bilateral 02/11/2019    Priority: Low  . Jock itch 07/30/2014    Priority: Low  . Allergic rhinitis 09/04/2007    Priority: Low   . Right shoulder pain 10/10/2019    Medications- reviewed and updated Current Outpatient Medications  Medication Sig Dispense Refill  . aspirin 325 MG tablet Take 325 mg by mouth daily.    Marland Kitchen atorvastatin (LIPITOR) 20 MG tablet TAKE 1 TABLET BY MOUTH  DAILY 120 tablet 2  . glimepiride (AMARYL) 2 MG tablet TAKE 1 TABLET BY MOUTH  DAILY WITH BREAKFAST 120 tablet 2  . JARDIANCE 10 MG TABS tablet TAKE 1 TABLET BY MOUTH DAILY 90 tablet 0  . metFORMIN (GLUCOPHAGE) 1000 MG tablet TAKE 1 TABLET BY MOUTH  TWICE DAILY WITH A MEAL 240 tablet 2   No current facility-administered medications for this visit.     Objective:  No self reported vitals Gen: NAD, resting comfortably Lungs: nonlabored, normal respiratory rate  Skin: appears dry, no obvious rash    Assessment and Plan   # Covid Positive S: On Thursday of last week patient started with light sore throat and coughing (above baseline). Patient stated that he his sore throat and cough worsened Sunday night. Today he reports feeling better in regards to cough and sore throat- still has some hoarseness.  He was tested on Monday and it came back positive for covid 19. He stated that he is feeling much better. Reports no other symptoms. He has taken otc cough drops and cold max multi-symptom tablets.   Wife is closest contact and trying to line up a test. Her last close contact was Wednesday of last week and some on Thursday.   He is vaccinated and boosted- with last booster  on 05/20/20 A/P:  Patient with testing confirming covid 19 with first day of covid 19 symptoms January 30th Therefore: - recommended patient watch closely for shortness of breath or confusion or worsening symptoms and if those occur patient should contact us immediately or seek care in the emergency department -recommended patient consider purchasing pulse oximeter and if levels 94% or below persistently- seek care at the hospital - Patient needs to self isolate  for at  least 10 days since first symptom AND at least 24 hours fever free without fever reducing medications AND have improvement in respiratory symptoms . Did discuss technically he can end self isolation as long as wears mask as triple vaxed plus past day 5 and significant improvement in symptoms as long as he is afebrile for 24 hours.  - earliest possible day out of self isolation January 10th from more conservative stance but since he is vaccinated and boosted and significant improvement In respiratory symptoms already- he can technically go out of home if needed if masked, recommendations for patient - he should inform close contacts about exposure (anyone patient been around unmasked for more than 15 minutes) . Discussed testing for wife reasonable since over 5 days since last close contact.  If High risk for complications-we discussed potential for antibody treatment- pateint wants to decline for now as improving but will contact me if changes his mind  Recommended follow up: as needed for acute concerns  Lab/Order associations:   ICD-10-CM   1. COVID-19  U07.1    Time Spent: 17 minutes of total time (11:46 AM- 12:03 PM) was spent on the date of the encounter performing the following actions: chart review prior to seeing the patient, obtaining history, performing a medically necessary exam, counseling on the treatment plan, placing orders, and documenting in our EHR.   Return precautions advised.  Tana Conch, MD

## 2020-08-10 ENCOUNTER — Ambulatory Visit: Payer: 59 | Admitting: Family Medicine

## 2020-08-23 NOTE — Patient Instructions (Addendum)
Please stop by lab before you go If you have mychart- we will send your results within 3 business days of [place] receiving them.  If you do not have mychart- we will call you about results within 5 business days of [place] receiving them.  *please also note that you will see labs on mychart as soon as they post. I will later go in and write notes on them- will say "notes from Dr. Yong Channel"   Recommended follow up: Return in about 6 months (around 02/21/2021) for physical

## 2020-08-23 NOTE — Progress Notes (Signed)
Phone [phone] In person visit   Subjective:   Juan Bennett is a 65 y.o. year old very pleasant male patient who presents for/with See problem oriented charting Chief Complaint  Patient presents with  . Follow-up  . Hyperlipidemia  . Diabetes    This visit occurred during the SARS-CoV-2 public health emergency.  Safety protocols were in place, including screening questions prior to the visit, additional usage of staff PPE, and extensive cleaning of exam room while observing appropriate contact time as indicated for disinfecting solutions.   Past Medical History-  Patient Active Problem List   Diagnosis Date Noted  . Well controlled diabetes mellitus ([place]) 09/04/2007    Priority: High  . History of adenomatous polyp of colon 04/20/2018    Priority: Medium  . Elevated LFTs 12/08/2014    Priority: Medium  . Goiter 10/15/2009    Priority: Medium  . Hyperlipidemia associated with type 2 diabetes mellitus ([place]) 09/04/2007    Priority: Medium  . Cataracts, both eyes 02/11/2019    Priority: Low  . Astigmatism with presbyopia, bilateral 02/11/2019    Priority: Low  . Jock itch 07/30/2014    Priority: Low  . Allergic rhinitis 09/04/2007    Priority: Low  . Right shoulder pain 10/10/2019    Medications- reviewed and updated Current Outpatient Medications  Medication Sig Dispense Refill  . aspirin 325 MG tablet Take 325 mg by mouth daily.    Marland Kitchen atorvastatin (LIPITOR) 20 MG tablet TAKE 1 TABLET BY MOUTH  DAILY 120 tablet 2  . glimepiride (AMARYL) 2 MG tablet TAKE 1 TABLET BY MOUTH  DAILY WITH BREAKFAST 120 tablet 2  . JARDIANCE 10 MG TABS tablet TAKE 1 TABLET BY MOUTH DAILY 90 tablet 0  . metFORMIN (GLUCOPHAGE) 1000 MG tablet TAKE 1 TABLET BY MOUTH  TWICE DAILY WITH A MEAL 240 tablet 2   No current facility-administered medications for this visit.     Objective:  BP 132/70   Pulse (!) 57   Temp 97.6 F (36.4 C)   Ht 5\' 11"  (1.803 m)   Wt 198 lb 9.6 oz (90.1 kg)    SpO2 98%   BMI 27.70 kg/m  Gen: NAD, resting comfortably CV: RRR no murmurs rubs or gallops Lungs: CTAB no crackles, wheeze, rhonchi Ext: no edema Skin: warm, dry    Assessment and Plan   #covid 19 breakthrough in January 2022  #hyperlipidemia #elevated LFTs in past S: Medication:Atorvastatin 20Mg  daily , also prefers to take aspriin for primary prevention (he has been on this since 20 and no GI bleed issues so we have opted to continue)  Lab Results  Component Value Date   CHOL 196 02/05/2020   HDL 61.60 02/05/2020   LDLCALC 119 (H) 02/05/2020   LDLDIRECT 124.0 09/30/2019   TRIG 77.0 02/05/2020   CHOLHDL 3 02/05/2020   A/P: mild poor control. glad LFts have been normal- we will update them today with labs. Likely simply continue current dose as do not want to adversely affect liver health  # Diabetes S: Medication: Jardiance 10Mg , glimipride 2Mg , Metformin 1000Mg  BID CBGs- does not check cbgs Exercise and diet- reasonably healthy diet but could do better- not doing as well as hed like on exercise- averages at least 2 days a week- encouraged to try to bump to 3 Lab Results  Component Value Date   HGBA1C 6.4 02/05/2020   HGBA1C 5.5 08/05/2019   HGBA1C 6.2 01/28/2019   A/P: hopefully controlled- update a1c with labs. Continue  current meds  Recommended follow up: Return in about 6 months (around 02/21/2021) for follow up- or sooner if needed.  Lab/Order associations:   ICD-10-CM   1. Hyperlipidemia associated with type 2 diabetes mellitus (HCC)  E11.69 Comprehensive metabolic panel   K81.2   2. Well controlled diabetes mellitus ([place])  E11.9 Hemoglobin A1c    Comprehensive metabolic panel  3. Elevated LFTs  R79.89    Return precautions advised.  [name], MD

## 2020-08-24 ENCOUNTER — Other Ambulatory Visit: Payer: Self-pay

## 2020-08-24 ENCOUNTER — Encounter: Payer: Self-pay | Admitting: Family Medicine

## 2020-08-24 ENCOUNTER — Ambulatory Visit: Payer: 59 | Admitting: Family Medicine

## 2020-08-24 VITALS — BP 132/70 | HR 57 | Temp 97.6°F | Ht 71.0 in | Wt 198.6 lb

## 2020-08-24 DIAGNOSIS — E785 Hyperlipidemia, unspecified: Secondary | ICD-10-CM

## 2020-08-24 DIAGNOSIS — E119 Type 2 diabetes mellitus without complications: Secondary | ICD-10-CM

## 2020-08-24 DIAGNOSIS — E1169 Type 2 diabetes mellitus with other specified complication: Secondary | ICD-10-CM

## 2020-08-24 DIAGNOSIS — R7989 Other specified abnormal findings of blood chemistry: Secondary | ICD-10-CM | POA: Diagnosis not present

## 2020-08-24 LAB — COMPREHENSIVE METABOLIC PANEL
ALT: 19 U/L (ref 0–53)
AST: 18 U/L (ref 0–37)
Albumin: 4.7 g/dL (ref 3.5–5.2)
Alkaline Phosphatase: 68 U/L (ref 39–117)
BUN: 10 mg/dL (ref 6–23)
CO2: 24 mEq/L (ref 19–32)
Calcium: 9.9 mg/dL (ref 8.4–10.5)
Chloride: 104 mEq/L (ref 96–112)
Creatinine, Ser: 0.94 mg/dL (ref 0.40–1.50)
GFR: 86.36 mL/min (ref >60.00)
Glucose, Bld: 99 mg/dL (ref 70–99)
Potassium: 4 mEq/L (ref 3.5–5.1)
Sodium: 137 mEq/L (ref 135–145)
Total Bilirubin: 0.6 mg/dL (ref 0.2–1.2)
Total Protein: 7.4 g/dL (ref 6.0–8.3)

## 2020-08-24 LAB — HEMOGLOBIN A1C: Hgb A1c MFr Bld: 6.6 % — ABNORMAL HIGH (ref 4.6–6.5)

## 2020-09-24 ENCOUNTER — Other Ambulatory Visit: Payer: Self-pay | Admitting: Family Medicine

## 2020-12-18 ENCOUNTER — Other Ambulatory Visit: Payer: Self-pay | Admitting: Family Medicine

## 2021-01-15 ENCOUNTER — Other Ambulatory Visit: Payer: Self-pay | Admitting: Family Medicine

## 2021-02-16 NOTE — Progress Notes (Signed)
Phone: [phone]   Subjective:  Patient presents today for their annual physical. Chief complaint-noted.   See problem oriented charting- ROS- full  review of systems was completed and negative  except for: dental issues- had bridge done, runny nose, tinnitus  The following were reviewed and entered/updated in epic: Past Medical History:  Diagnosis Date   Allergy    Astigmatism with presbyopia, bilateral 02/11/2019   Cataracts, both eyes 02/11/2019   Diabetes mellitus    Hyperlipidemia    Patient Active Problem List   Diagnosis Date Noted   Well controlled diabetes mellitus ([place]) 09/04/2007    Priority: High   History of adenomatous polyp of colon 04/20/2018    Priority: Medium   Elevated LFTs 12/08/2014    Priority: Medium   Goiter 10/15/2009    Priority: Medium   Hyperlipidemia associated with type 2 diabetes mellitus ([place]) 09/04/2007    Priority: Medium   Cataracts, both eyes 02/11/2019    Priority: Low   Astigmatism with presbyopia, bilateral 02/11/2019    Priority: Low   Jock itch 07/30/2014    Priority: Low   Allergic rhinitis 09/04/2007    Priority: Low   Right shoulder pain 10/10/2019   Past Surgical History:  Procedure Laterality Date   COLONOSCOPY  10/26/2007   w/Brodie   WISDOM TOOTH EXTRACTION      Family History  Problem Relation Age of Onset   Diabetes Mother    Dementia Mother        fall, hip fracture, she didnt improve   Deep vein thrombosis Father    Diabetes Paternal Uncle    Colon cancer Neg Hx    Esophageal cancer Neg Hx    Stomach cancer Neg Hx    Rectal cancer Neg Hx    Colon polyps Neg Hx     Medications- reviewed and updated Current Outpatient Medications  Medication Sig Dispense Refill   aspirin 325 MG tablet Take 325 mg by mouth daily.     atorvastatin (LIPITOR) 20 MG tablet TAKE 1 TABLET BY MOUTH  DAILY 120 tablet 2   glimepiride (AMARYL) 2 MG tablet TAKE 1 TABLET BY MOUTH  DAILY WITH BREAKFAST 120 tablet 2   JARDIANCE  10 MG TABS tablet TAKE 1 TABLET BY MOUTH DAILY 90 tablet 0   metFORMIN (GLUCOPHAGE) 1000 MG tablet TAKE 1 TABLET BY MOUTH  TWICE DAILY WITH A MEAL 240 tablet 2   No current facility-administered medications for this visit.    Allergies-reviewed and updated No Known Allergies  Social History   Social History Narrative   Married since 22. Daughter [employer] grad as of may 2020- . Retired age 13. Caring for dad and father in law.    Work at [employer] and gamble long time.       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 Oropharynx normal Neck: no thyromegaly CV: RRR no murmurs rubs or gallops Lungs: CTAB no crackles, wheeze, rhonchi Abdomen: soft/nontender/nondistended/normal bowel sounds. No rebound or guarding.  Ext: no edema Skin: warm, dry Neuro: grossly normal, moves all extremities, PERRLA  Diabetic Foot Exam - Simple   Simple Foot Form Diabetic Foot  exam was performed with the following findings: Yes 02/17/2021  9:46 AM  Visual Inspection No deformities, no ulcerations, no other skin breakdown bilaterally: Yes Sensation Testing Intact to touch and monofilament testing bilaterally: Yes Pulse Check Posterior Tibialis and Dorsalis pulse intact bilaterally: Yes Comments Callous noted below MTP joint on great toes bilatearlly and on 5th toe on right foot only - he is keeping these well managed      Assessment and Plan  65 y.o. male presenting for annual physical.  Health Maintenance counseling: 1. Anticipatory guidance: Patient counseled regarding regular dental exams -q6 months, eye exams -yearly, however, he is overdue since 02/16/2021 for ophthalmology exam- has visit next week,  avoiding smoking and  second hand smoke , limiting alcohol to 2 beverages per day -2 times a week - on average 3 beers.    2. Risk factor reduction:  Advised patient of need for regular exercise and diet rich and fruits and vegetables to reduce risk of heart attack and stroke. Exercise-  walks on average 2 times a week for about 20 minutes - encouraged to increase . Diet-he feels he eats reasonably healthy-he was surprised weight up 6 lbs from last year- could try myfitness pal Wt Readings from Last 3 Encounters:  02/17/21 199 lb 6.4 oz (90.4 kg)  08/24/20 198 lb 9.6 oz (90.1 kg)  02/05/20 193 lb 12.8 oz (87.9 kg)  3. Immunizations/screenings/ancillary studies- COVID-19 4th booster discussed-patient also had COVID in January in addition to 3 prior immunizations- he thinks may have had 4th- will check- otherwise may wait until omicron specific vaccine. otherwise up-to-date. Immunization History  Administered Date(s) Administered   Hep A / Hep B 07/17/2015, 08/17/2015, 01/15/2016   Influenza Whole 05/25/2009   Influenza,inj,Quad PF,6+ Mos 04/23/2015, 05/17/2017, 05/12/2018   Influenza-Unspecified 06/08/2014, 03/28/2016, 05/12/2018, 04/09/2019, 06/10/2020   PFIZER(Purple Top)SARS-COV-2 Vaccination 08/04/2019, 08/24/2019, 05/20/2020   Pneumococcal Conjugate-13 09/29/2008   Pneumococcal Polysaccharide-23 09/29/2008, 02/20/2014   Td 08/25/2006   Tdap 09/08/2016   Zoster Recombinat (Shingrix) 01/17/2017, 03/20/2017   4. Prostate cancer screening- low risk prior PSA trend - continue to trend today  Lab Results  Component Value Date   PSA 0.54 02/05/2020   PSA 0.63 01/28/2019   PSA 0.83 01/24/2018   5. Colon cancer screening - 04/16/2018 with a 5-year repeat planned. Next due in 2024 6. Skin cancer screening- not followed by dermatology. advised regular sunscreen use. Denies worrisome, changing, or new skin lesions.  7. never smoker 8. STD screening - declines  Status of chronic or acute concerns   # Diabetes S:  Medication:Jardiance 10 mg, metformin 1000 mg BID, and Glimepride 2 mg  CBGs-does not check CBGs Exercise and diet-see discussion above Lab Results  Component Value Date   HGBA1C 6.6 (H) 08/24/2020   HGBA1C 6.4 02/05/2020   HGBA1C 5.5 08/05/2019   A/P:  hopefully stable- update A1c today. Continue current meds for now  -Try to obtain copy  eye exam next week  #hyperlipidemia/elevated LFTs S: Medication: currently on atorvastatin 20 mg daily- no issues reported -LFTs elevated in the past but recently have been well controlled Lab Results  Component Value Date   CHOL 196 02/05/2020   HDL 61.60 02/05/2020   LDLCALC 119 (H) 02/05/2020   LDLDIRECT 124.0 09/30/2019   TRIG 77.0 02/05/2020   CHOLHDL 3 02/05/2020   A/P:  hopefully stable or improved- update lipid panel and CMP today. Continue current meds for now - hoping to get LDL below 100 if possible while  keeping liver function stable  #Belching- improved from last year- not clear what caused improvement. States has pretty much gone away- did tums or rolaids for a bit but went away  #Goiter noted in the past-continue to trend TSH  Recommended follow up: Return in about 6 months (around 08/20/2021) for follow up- or sooner if needed.  Lab/Order associations: fasting   ICD-10-CM   1. Preventative health care  Z00.00 CBC with Differential/Platelet    Comprehensive metabolic panel    Lipid panel    Hemoglobin A1c    PSA    Microalbumin / creatinine urine ratio    TSH    2. Hyperlipidemia associated with type 2 diabetes mellitus (HCC)  E11.69 CBC with Differential/Platelet   E78.5 Comprehensive metabolic panel    Lipid panel    3. Diabetes mellitus without complication (HCC)  [id number] CBC with Differential/Platelet    Comprehensive metabolic panel    Lipid panel    Hemoglobin A1c    Microalbumin / creatinine urine ratio    4. Screening for prostate cancer  Z12.5 PSA    5. Goiter  E04.9 TSH      No orders of the defined  types were placed in this encounter.  Return precautions advised.  [name], MD

## 2021-02-17 ENCOUNTER — Other Ambulatory Visit: Payer: Self-pay

## 2021-02-17 ENCOUNTER — Ambulatory Visit (INDEPENDENT_AMBULATORY_CARE_PROVIDER_SITE_OTHER): Payer: 59 | Admitting: Family Medicine

## 2021-02-17 ENCOUNTER — Encounter: Payer: Self-pay | Admitting: Family Medicine

## 2021-02-17 VITALS — BP 139/80 | HR 61 | Temp 98.0°F | Ht 71.0 in | Wt 199.4 lb

## 2021-02-17 DIAGNOSIS — E1169 Type 2 diabetes mellitus with other specified complication: Secondary | ICD-10-CM

## 2021-02-17 DIAGNOSIS — E785 Hyperlipidemia, unspecified: Secondary | ICD-10-CM | POA: Diagnosis not present

## 2021-02-17 DIAGNOSIS — E119 Type 2 diabetes mellitus without complications: Secondary | ICD-10-CM | POA: Diagnosis not present

## 2021-02-17 DIAGNOSIS — E049 Nontoxic goiter, unspecified: Secondary | ICD-10-CM

## 2021-02-17 DIAGNOSIS — Z125 Encounter for screening for malignant neoplasm of prostate: Secondary | ICD-10-CM | POA: Diagnosis not present

## 2021-02-17 DIAGNOSIS — Z0000 Encounter for general adult medical examination without abnormal findings: Secondary | ICD-10-CM

## 2021-02-17 LAB — CBC WITH DIFFERENTIAL/PLATELET
Basophils Absolute: 0.1 10*3/uL (ref 0.0–0.1)
Basophils Relative: 1.3 % (ref 0.0–3.0)
Eosinophils Absolute: 0.1 10*3/uL (ref 0.0–0.7)
Eosinophils Relative: 3.6 % (ref 0.0–5.0)
HCT: 46.9 % (ref 39.0–52.0)
Hemoglobin: 15.2 g/dL (ref 13.0–17.0)
Lymphocytes Relative: 28.1 % (ref 12.0–46.0)
Lymphs Abs: 1.1 10*3/uL (ref 0.7–4.0)
MCHC: 32.4 g/dL (ref 30.0–36.0)
MCV: 90.7 fl (ref 78.0–100.0)
Monocytes Absolute: 0.4 10*3/uL (ref 0.1–1.0)
Monocytes Relative: 9.5 % (ref 3.0–12.0)
Neutro Abs: 2.2 10*3/uL (ref 1.4–7.7)
Neutrophils Relative %: 57.5 % (ref 43.0–77.0)
Platelets: 177 10*3/uL (ref 150.0–400.0)
RBC: 5.17 Mil/uL (ref 4.22–5.81)
RDW: 13.7 % (ref 11.5–15.5)
WBC: 3.8 10*3/uL — ABNORMAL LOW (ref 4.0–10.5)

## 2021-02-17 LAB — MICROALBUMIN / CREATININE URINE RATIO
Creatinine,U: 80.8 mg/dL
Microalb Creat Ratio: 0.9 mg/g (ref 0.0–30.0)
Microalb, Ur: <0.7 mg/dL (ref 0.0–1.9)

## 2021-02-17 LAB — COMPREHENSIVE METABOLIC PANEL
ALT: 18 U/L (ref 0–53)
AST: 16 U/L (ref 0–37)
Albumin: 4.4 g/dL (ref 3.5–5.2)
Alkaline Phosphatase: 62 U/L (ref 39–117)
BUN: 11 mg/dL (ref 6–23)
CO2: 21 mEq/L (ref 19–32)
Calcium: 9.4 mg/dL (ref 8.4–10.5)
Chloride: 108 mEq/L (ref 96–112)
Creatinine, Ser: 0.83 mg/dL (ref 0.40–1.50)
GFR: 92.92 mL/min (ref >60.00)
Glucose, Bld: 128 mg/dL — ABNORMAL HIGH (ref 70–99)
Potassium: 4.2 mEq/L (ref 3.5–5.1)
Sodium: 139 mEq/L (ref 135–145)
Total Bilirubin: 0.6 mg/dL (ref 0.2–1.2)
Total Protein: 7 g/dL (ref 6.0–8.3)

## 2021-02-17 LAB — PSA: PSA: 0.72 ng/mL (ref 0.10–4.00)

## 2021-02-17 LAB — TSH: TSH: 1.89 u[IU]/mL (ref 0.35–5.50)

## 2021-02-17 LAB — HEMOGLOBIN A1C: Hgb A1c MFr Bld: 6.8 % — ABNORMAL HIGH (ref 4.6–6.5)

## 2021-02-17 LAB — LIPID PANEL
Cholesterol: 201 mg/dL — ABNORMAL HIGH (ref 0–200)
HDL: 55.8 mg/dL (ref >39.00)
LDL Cholesterol: 124 mg/dL — ABNORMAL HIGH (ref 0–99)
NonHDL: 144.8
Total CHOL/HDL Ratio: 4
Triglycerides: 106 mg/dL (ref 0.0–149.0)
VLDL: 21.2 mg/dL (ref 0.0–40.0)

## 2021-02-17 NOTE — Patient Instructions (Addendum)
Health Maintenance Due  Topic Date Due   COVID-19 Vaccine- if you did get a shot after 05/20/20 let us know 09/20/2020   OPHTHALMOLOGY EXAM have them send [place] a copy after you see them next week 02/16/2021   Team give him a bottled water or two please   Please stop by lab before you go If you have mychart- we will send your results within 3 business days of [place] receiving them.  If you do not have mychart- we will call you about results within 5 business days of [place] receiving them.  *please also note that you will see labs on mychart as soon as they post. I will later go in and write notes on them- will say "notes from Dr. Yong Channel"  Could try myfitnesspal to help get wait back down a few lbs. Increasing walking can help as well.   Recommended follow up: Return in about 6 months (around 08/20/2021) for follow up- or sooner if needed.

## 2021-02-19 ENCOUNTER — Encounter: Payer: Self-pay | Admitting: Family Medicine

## 2021-02-19 ENCOUNTER — Other Ambulatory Visit: Payer: Self-pay

## 2021-02-19 DIAGNOSIS — E785 Hyperlipidemia, unspecified: Secondary | ICD-10-CM

## 2021-02-19 DIAGNOSIS — E1169 Type 2 diabetes mellitus with other specified complication: Secondary | ICD-10-CM

## 2021-02-19 MED ORDER — ATORVASTATIN CALCIUM 20 MG PO TABS: ORAL_TABLET | ORAL | 3 refills | Status: DC

## 2021-02-22 ENCOUNTER — Other Ambulatory Visit: Payer: Self-pay

## 2021-02-22 DIAGNOSIS — E785 Hyperlipidemia, unspecified: Secondary | ICD-10-CM

## 2021-02-22 MED ORDER — ATORVASTATIN CALCIUM 20 MG PO TABS: ORAL_TABLET | ORAL | 3 refills | Status: DC

## 2021-03-01 LAB — HM DIABETES EYE EXAM

## 2021-03-02 ENCOUNTER — Encounter: Payer: Self-pay | Admitting: Family Medicine

## 2021-03-10 ENCOUNTER — Other Ambulatory Visit: Payer: Self-pay | Admitting: Family Medicine

## 2021-03-30 ENCOUNTER — Other Ambulatory Visit (INDEPENDENT_AMBULATORY_CARE_PROVIDER_SITE_OTHER): Payer: 59

## 2021-03-30 DIAGNOSIS — E1169 Type 2 diabetes mellitus with other specified complication: Secondary | ICD-10-CM

## 2021-03-30 DIAGNOSIS — E785 Hyperlipidemia, unspecified: Secondary | ICD-10-CM | POA: Diagnosis not present

## 2021-03-30 LAB — HEPATIC FUNCTION PANEL
ALT: 22 U/L (ref 0–53)
AST: 17 U/L (ref 0–37)
Albumin: 4.2 g/dL (ref 3.5–5.2)
Alkaline Phosphatase: 60 U/L (ref 39–117)
Bilirubin, Direct: 0.2 mg/dL (ref 0.0–0.3)
Total Bilirubin: 0.7 mg/dL (ref 0.2–1.2)
Total Protein: 7.2 g/dL (ref 6.0–8.3)

## 2021-03-30 LAB — LDL CHOLESTEROL, DIRECT: Direct LDL: 100 mg/dL

## 2021-03-31 ENCOUNTER — Encounter: Payer: Self-pay | Admitting: Family Medicine

## 2021-04-01 ENCOUNTER — Other Ambulatory Visit: Payer: Self-pay

## 2021-04-01 DIAGNOSIS — E785 Hyperlipidemia, unspecified: Secondary | ICD-10-CM

## 2021-04-01 MED ORDER — ATORVASTATIN CALCIUM 40 MG PO TABS: ORAL_TABLET | ORAL | 3 refills | Status: DC

## 2021-04-10 ENCOUNTER — Encounter: Payer: Self-pay | Admitting: Family Medicine

## 2021-06-23 ENCOUNTER — Other Ambulatory Visit: Payer: Self-pay | Admitting: Family Medicine

## 2021-08-20 NOTE — Progress Notes (Signed)
Phone [phone] In person visit   Subjective:   Juan Bennett is a 66 y.o. year old very pleasant male patient who presents for/with See problem oriented charting Chief Complaint  Patient presents with   Diabetes   Hyperlipidemia    This visit occurred during the SARS-CoV-2 public health emergency.  Safety protocols were in place, including screening questions prior to the visit, additional usage of staff PPE, and extensive cleaning of exam room while observing appropriate contact time as indicated for disinfecting solutions.   Past Medical History-  Patient Active Problem List   Diagnosis Date Noted   Well controlled diabetes mellitus ([place]) 09/04/2007    Priority: High   History of adenomatous polyp of colon 04/20/2018    Priority: Medium    Elevated LFTs 12/08/2014    Priority: Medium    Goiter 10/15/2009    Priority: Medium    Hyperlipidemia associated with type 2 diabetes mellitus ([place]) 09/04/2007    Priority: Medium    Cataracts, both eyes 02/11/2019    Priority: Low   Astigmatism with presbyopia, bilateral 02/11/2019    Priority: Low   Jock itch 07/30/2014    Priority: Low   Allergic rhinitis 09/04/2007    Priority: Low   Right shoulder pain 10/10/2019    Medications- reviewed and updated Current Outpatient Medications  Medication Sig Dispense Refill   aspirin 325 MG tablet Take 325 mg by mouth daily.     atorvastatin (LIPITOR) 40 MG tablet TAKE 1 TABLET BY MOUTH  DAILY 90 tablet 3   glimepiride (AMARYL) 2 MG tablet TAKE 1 TABLET BY MOUTH  DAILY WITH BREAKFAST 120 tablet 2   JARDIANCE 10 MG TABS tablet TAKE 1 TABLET BY MOUTH DAILY 90 tablet 0   metFORMIN (GLUCOPHAGE) 1000 MG tablet TAKE 1 TABLET BY MOUTH  TWICE DAILY WITH A MEAL 240 tablet 2   No current facility-administered medications for this visit.     Objective:  BP 127/74    Pulse 62    Temp 97.6 F (36.4 C) (Temporal)    Wt 196 lb (88.9 kg)    SpO2 97%    BMI 27.34 kg/m  Gen: NAD, resting  comfortably CV: RRR no murmurs rubs or gallops Lungs: CTAB no crackles, wheeze, rhonchi Ext: no edema Skin: warm, dry     Assessment and Plan   # Diabetes-A1c typically under 7 S: Medication:Jardiance 10 mg daily, metformin 1000 mg twice daily, and Glimepride 2 mg daily CBGs- not checking- has meter if needs Exercise and diet- some walking- goal 150 mins a week, mild weight loss despite holidays  A/P:  hopefully stable- update a1c today. Continue current meds for now   #hyperlipidemia/elevated LFTs-peak LDL 176 S: Medication: currently on atorvastatin 40 mg daily-with improvement of LDL down to 100 -LFTs elevated in the past but recently had been well controlled -Mild poor control on this A/P: Overall significant improvement and thankfully LFTs have not worsened-continue to monitor   #Goiter noted in the past-continue to trend TSH at least annually  Lab Results  Component Value Date   TSH 1.89 02/17/2021   #Mild leukopenia last visit-update CBC with labs today. Rest of cell lines normal- do not suspect significant underlying issue Lab Results  Component Value Date   WBC 3.8 (L) 02/17/2021   HGB 15.2 02/17/2021   HCT 46.9 02/17/2021   MCV 90.7 02/17/2021   PLT 177.0 02/17/2021   Recommended follow up: Return in about 6 months (around 02/22/2022) for physical  or sooner if needed.  Lab/Order associations:   ICD-10-CM   1. Diabetes mellitus without complication (HCC)  X80.6     2. Hyperlipidemia associated with type 2 diabetes mellitus ([place])  E11.69    E78.5     3. Elevated LFTs  R79.89      No orders of the defined types were placed in this encounter.  I,Jada Bradford,acting as a scribe for [name], MD.,have documented all relevant documentation on the behalf of [name], MD,as directed by  [name], MD while in the presence of [name], MD.  I, [name], MD, have reviewed all documentation for this visit. The documentation on 08/25/21 for  the exam, diagnosis, procedures, and orders are all accurate and complete.  Return precautions advised.  [name], MD

## 2021-08-25 ENCOUNTER — Other Ambulatory Visit: Payer: Self-pay

## 2021-08-25 ENCOUNTER — Ambulatory Visit: Payer: 59 | Admitting: Family Medicine

## 2021-08-25 ENCOUNTER — Encounter: Payer: Self-pay | Admitting: Family Medicine

## 2021-08-25 VITALS — BP 127/74 | HR 62 | Temp 97.6°F | Wt 196.0 lb

## 2021-08-25 DIAGNOSIS — E119 Type 2 diabetes mellitus without complications: Secondary | ICD-10-CM

## 2021-08-25 DIAGNOSIS — R7989 Other specified abnormal findings of blood chemistry: Secondary | ICD-10-CM

## 2021-08-25 DIAGNOSIS — E1169 Type 2 diabetes mellitus with other specified complication: Secondary | ICD-10-CM | POA: Diagnosis not present

## 2021-08-25 DIAGNOSIS — E785 Hyperlipidemia, unspecified: Secondary | ICD-10-CM | POA: Diagnosis not present

## 2021-08-25 LAB — CBC WITH DIFFERENTIAL/PLATELET
Basophils Absolute: 0 10*3/uL (ref 0.0–0.1)
Basophils Relative: 1.3 % (ref 0.0–3.0)
Eosinophils Absolute: 0.1 10*3/uL (ref 0.0–0.7)
Eosinophils Relative: 2.7 % (ref 0.0–5.0)
HCT: 46.6 % (ref 39.0–52.0)
Hemoglobin: 15.4 g/dL (ref 13.0–17.0)
Lymphocytes Relative: 29.3 % (ref 12.0–46.0)
Lymphs Abs: 1.2 10*3/uL (ref 0.7–4.0)
MCHC: 33 g/dL (ref 30.0–36.0)
MCV: 89.7 fl (ref 78.0–100.0)
Monocytes Absolute: 0.4 10*3/uL (ref 0.1–1.0)
Monocytes Relative: 10.3 % (ref 3.0–12.0)
Neutro Abs: 2.2 10*3/uL (ref 1.4–7.7)
Neutrophils Relative %: 56.4 % (ref 43.0–77.0)
Platelets: 191 10*3/uL (ref 150.0–400.0)
RBC: 5.19 Mil/uL (ref 4.22–5.81)
RDW: 13.6 % (ref 11.5–15.5)
WBC: 3.9 10*3/uL — ABNORMAL LOW (ref 4.0–10.5)

## 2021-08-25 LAB — COMPREHENSIVE METABOLIC PANEL
ALT: 19 U/L (ref 0–53)
AST: 17 U/L (ref 0–37)
Albumin: 4.5 g/dL (ref 3.5–5.2)
Alkaline Phosphatase: 62 U/L (ref 39–117)
BUN: 13 mg/dL (ref 6–23)
CO2: 26 mEq/L (ref 19–32)
Calcium: 9.9 mg/dL (ref 8.4–10.5)
Chloride: 107 mEq/L (ref 96–112)
Creatinine, Ser: 0.91 mg/dL (ref 0.40–1.50)
GFR: 89.16 mL/min (ref >60.00)
Glucose, Bld: 126 mg/dL — ABNORMAL HIGH (ref 70–99)
Potassium: 4.2 mEq/L (ref 3.5–5.1)
Sodium: 140 mEq/L (ref 135–145)
Total Bilirubin: 0.5 mg/dL (ref 0.2–1.2)
Total Protein: 7.2 g/dL (ref 6.0–8.3)

## 2021-08-25 LAB — HEMOGLOBIN A1C: Hgb A1c MFr Bld: 6.8 % — ABNORMAL HIGH (ref 4.6–6.5)

## 2021-08-25 NOTE — Patient Instructions (Addendum)
Please try exercising at least 150 minutes per week (at least 30 minutes a day).   Please check with your pharmacy to see if your insurance covers your covid bivalent shot - If you decide to schedule an appointment with them, please let us know.   Please stop by lab before you go If you have mychart- we will send your results within 3 business days of [place] receiving them.  If you do not have mychart- we will call you about results within 5 business days of [place] receiving them.  *please also note that you will see labs on mychart as soon as they post. I will later go in and write notes on them- will say "notes from Dr. Yong Channel"  Recommended follow up: Return in about 6 months (around 02/22/2022) for physical or sooner if needed.  So glad you're doing well!

## 2021-09-28 ENCOUNTER — Encounter: Payer: Self-pay | Admitting: Family Medicine

## 2021-10-01 ENCOUNTER — Other Ambulatory Visit: Payer: Self-pay | Admitting: Family Medicine

## 2021-12-19 ENCOUNTER — Other Ambulatory Visit: Payer: Self-pay | Admitting: Family Medicine

## 2022-01-08 ENCOUNTER — Other Ambulatory Visit: Payer: Self-pay | Admitting: Family Medicine

## 2022-01-08 DIAGNOSIS — E785 Hyperlipidemia, unspecified: Secondary | ICD-10-CM

## 2022-02-24 ENCOUNTER — Encounter: Payer: Self-pay | Admitting: Family Medicine

## 2022-02-24 ENCOUNTER — Ambulatory Visit (INDEPENDENT_AMBULATORY_CARE_PROVIDER_SITE_OTHER): Payer: 59 | Admitting: Family Medicine

## 2022-02-24 VITALS — BP 100/60 | HR 62 | Temp 98.0°F | Ht 71.0 in | Wt 197.6 lb

## 2022-02-24 DIAGNOSIS — E119 Type 2 diabetes mellitus without complications: Secondary | ICD-10-CM

## 2022-02-24 DIAGNOSIS — Z0000 Encounter for general adult medical examination without abnormal findings: Secondary | ICD-10-CM

## 2022-02-24 DIAGNOSIS — Z125 Encounter for screening for malignant neoplasm of prostate: Secondary | ICD-10-CM

## 2022-02-24 DIAGNOSIS — E049 Nontoxic goiter, unspecified: Secondary | ICD-10-CM

## 2022-02-24 DIAGNOSIS — Z1283 Encounter for screening for malignant neoplasm of skin: Secondary | ICD-10-CM

## 2022-02-24 LAB — CBC WITH DIFFERENTIAL/PLATELET
Basophils Absolute: 0 10*3/uL (ref 0.0–0.1)
Basophils Relative: 1.1 % (ref 0.0–3.0)
Eosinophils Absolute: 0.1 10*3/uL (ref 0.0–0.7)
Eosinophils Relative: 2.3 % (ref 0.0–5.0)
HCT: 47.1 % (ref 39.0–52.0)
Hemoglobin: 15.5 g/dL (ref 13.0–17.0)
Lymphocytes Relative: 28.1 % (ref 12.0–46.0)
Lymphs Abs: 1.3 10*3/uL (ref 0.7–4.0)
MCHC: 32.9 g/dL (ref 30.0–36.0)
MCV: 90.3 fl (ref 78.0–100.0)
Monocytes Absolute: 0.4 10*3/uL (ref 0.1–1.0)
Monocytes Relative: 9 % (ref 3.0–12.0)
Neutro Abs: 2.7 10*3/uL (ref 1.4–7.7)
Neutrophils Relative %: 59.5 % (ref 43.0–77.0)
Platelets: 187 10*3/uL (ref 150.0–400.0)
RBC: 5.22 Mil/uL (ref 4.22–5.81)
RDW: 13.9 % (ref 11.5–15.5)
WBC: 4.5 10*3/uL (ref 4.0–10.5)

## 2022-02-24 LAB — LIPID PANEL
Cholesterol: 189 mg/dL (ref 0–200)
HDL: 62.7 mg/dL (ref >39.00)
LDL Cholesterol: 105 mg/dL — ABNORMAL HIGH (ref 0–99)
NonHDL: 125.96
Total CHOL/HDL Ratio: 3
Triglycerides: 106 mg/dL (ref 0.0–149.0)
VLDL: 21.2 mg/dL (ref 0.0–40.0)

## 2022-02-24 LAB — COMPREHENSIVE METABOLIC PANEL
ALT: 21 U/L (ref 0–53)
AST: 19 U/L (ref 0–37)
Albumin: 4.6 g/dL (ref 3.5–5.2)
Alkaline Phosphatase: 64 U/L (ref 39–117)
BUN: 13 mg/dL (ref 6–23)
CO2: 22 mEq/L (ref 19–32)
Calcium: 9.4 mg/dL (ref 8.4–10.5)
Chloride: 104 mEq/L (ref 96–112)
Creatinine, Ser: 0.89 mg/dL (ref 0.40–1.50)
GFR: 90.33 mL/min (ref >60.00)
Glucose, Bld: 103 mg/dL — ABNORMAL HIGH (ref 70–99)
Potassium: 4.1 mEq/L (ref 3.5–5.1)
Sodium: 136 mEq/L (ref 135–145)
Total Bilirubin: 0.6 mg/dL (ref 0.2–1.2)
Total Protein: 7.3 g/dL (ref 6.0–8.3)

## 2022-02-24 LAB — HEMOGLOBIN A1C: Hgb A1c MFr Bld: 7.1 % — ABNORMAL HIGH (ref 4.6–6.5)

## 2022-02-24 LAB — MICROALBUMIN / CREATININE URINE RATIO
Creatinine,U: 77.7 mg/dL
Microalb Creat Ratio: 1.4 mg/g (ref 0.0–30.0)
Microalb, Ur: 1.1 mg/dL (ref 0.0–1.9)

## 2022-02-24 LAB — TSH: TSH: 1.63 u[IU]/mL (ref 0.35–5.50)

## 2022-02-24 LAB — PSA: PSA: 0.73 ng/mL (ref 0.10–4.00)

## 2022-02-24 NOTE — Progress Notes (Signed)
Phone: [phone]   Subjective:  Patient presents today for their annual physical. Chief complaint-noted.   See problem oriented charting- ROS- full  review of systems was completed and negative  except for: tinnitus- stable  The following were reviewed and entered/updated in epic: Past Medical History:  Diagnosis Date   Allergy    Astigmatism with presbyopia, bilateral 02/11/2019   Cataracts, both eyes 02/11/2019   Diabetes mellitus    Hyperlipidemia    Patient Active Problem List   Diagnosis Date Noted   Well controlled diabetes mellitus ([place]) 09/04/2007    Priority: High   History of adenomatous polyp of colon 04/20/2018    Priority: Medium    Elevated LFTs 12/08/2014    Priority: Medium    Goiter 10/15/2009    Priority: Medium    Hyperlipidemia associated with type 2 diabetes mellitus ([place]) 09/04/2007    Priority: Medium    Cataracts, both eyes 02/11/2019    Priority: Low   Astigmatism with presbyopia, bilateral 02/11/2019    Priority: Low   Jock itch 07/30/2014    Priority: Low   Allergic rhinitis 09/04/2007    Priority: Low   Right shoulder pain 10/10/2019   Past Surgical History:  Procedure Laterality Date   COLONOSCOPY  10/26/2007   w/Brodie   WISDOM TOOTH EXTRACTION      Family History  Problem Relation Age of Onset   Diabetes Mother    Dementia Mother        fall, hip fracture, she didnt improve   Deep vein thrombosis Father    Diabetes Paternal Uncle    Colon cancer Neg Hx    Esophageal cancer Neg Hx    Stomach cancer Neg Hx    Rectal cancer Neg Hx    Colon polyps Neg Hx     Medications- reviewed and updated Current Outpatient Medications  Medication Sig Dispense Refill   aspirin 325 MG tablet Take 325 mg by mouth daily.     atorvastatin (LIPITOR) 40 MG tablet TAKE 1 TABLET BY MOUTH  DAILY 120 tablet 2   glimepiride (AMARYL) 2 MG tablet TAKE 1 TABLET BY MOUTH  DAILY WITH BREAKFAST 120 tablet 2   JARDIANCE 10 MG TABS tablet TAKE 1 TABLET  BY MOUTH DAILY 90 tablet 0   metFORMIN (GLUCOPHAGE) 1000 MG tablet TAKE 1 TABLET BY MOUTH  TWICE DAILY WITH A MEAL 240 tablet 2   No current facility-administered medications for this visit.    Allergies-reviewed and updated No Known Allergies  Social History   Social History Narrative   Married since 75. Daughter Elsie grad as of may 2020- . Retired age 74. Caring for dad and father in law.    Work at [employer] and gamble long time.       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 Oropharynx normal Neck: stable thyromegaly without nodules CV: RRR no murmurs rubs or gallops Lungs: CTAB no crackles, wheeze, rhonchi Abdomen: soft/nontender/nondistended/normal bowel sounds. No rebound or guarding.  Ext: no edema Skin: warm, dry, almost triangular shaped lesion in left groin 3 x 2 cm on one end with darker appearance- slightly raised - suspect  seborrheic keratosis Neuro: grossly normal, moves all extremities, PERRLA    Diabetic Foot Exam - Simple   Simple Foot Form Diabetic Foot exam was performed with the following findings: Yes 02/24/2022 10:35 AM  Visual Inspection No deformities, no ulcerations, no other skin breakdown bilaterally: Yes Sensation Testing Intact to touch and monofilament testing bilaterally: Yes Pulse Check Posterior Tibialis and Dorsalis pulse intact bilaterally: Yes Comments        Assessment and Plan  66 y.o. male presenting for annual physical.  Health Maintenance counseling: 1. Anticipatory guidance: Patient counseled regarding regular dental exams -q6 months, eye exams -yearly,  avoiding smoking and second hand smoke , limiting alcohol to 2 beverages per day-about 6 beers a week, no illicit  drugs.   2. Risk factor reduction:  Advised patient of need for regular exercise and diet rich and fruits and vegetables to reduce risk of heart attack and stroke.  Exercise- 20 mins twice a week- encouraged to increase.  Diet/weight management-Down 2 pounds from last year- possibly related to recent loss Wt Readings from Last 3 Encounters:  02/24/22 197 lb 9.6 oz (89.6 kg)  08/25/21 196 lb (88.9 kg)  02/17/21 199 lb 6.4 oz (90.4 kg)  3. Immunizations/screenings/ancillary studies-consider COVID-19 vaccination in the fall once updated, discussed for flu shot in fall- he will send [place] info Immunization History  Administered Date(s) Administered   Hep A / Hep B 07/17/2015, 08/17/2015, 01/15/2016   Influenza Whole 05/25/2009   Influenza,inj,Quad PF,6+ Mos 04/23/2015, 05/17/2017, 05/12/2018, 04/06/2021   Influenza-Unspecified 06/08/2014, 03/28/2016, 05/12/2018, 04/09/2019, 06/10/2020   PFIZER(Purple Top)SARS-COV-2 Vaccination 08/04/2019, 08/24/2019, 05/20/2020, 10/29/2020   Pneumococcal Conjugate-13 09/29/2008   Pneumococcal Polysaccharide-23 09/29/2008, 02/20/2014   Td 08/25/2006   Tdap 09/08/2016   Zoster Recombinat (Shingrix) 01/17/2017, 03/20/2017  4. Prostate cancer screening- low risk prior PSA trend-continue to trend with labs Lab Results  Component Value Date   PSA 0.72 02/17/2021   PSA 0.54 02/05/2020   PSA 0.63 01/28/2019   5. Colon cancer screening - April 16, 2018 with 5-year repeat planned 6. Skin cancer screening-does not see dermatology- he is willing to do a general screening and I also want him to point out the groin lesion when he sees them-refer [place] derm . advised regular sunscreen use. Denies worrisome, changing, or new skin lesions- stable mole in groin for years he thinks slightly larger in last year though  7. Smoking associated screening (lung cancer screening, AAA screen 65-75, UA)-never smoker 8. STD screening - declines-only active with wife  Status of chronic  or acute concerns   #Social update-Father Max died 02/04/2023- he is doing reasonably well   #Bowel movement changes- close to a year - once a week but will have 3 BMs in a row in one day- no pain when has bowel movement- not particularly hard. No blood in stool or dark black stool.   # Diabetes-A1c typically under 7 S: Medication:Jardiance 10 mg daily, metformin 1000 mg twice daily, and Glimepride 2 mg daily Lab Results  Component Value Date   HGBA1C 6.8 (H) 08/25/2021   HGBA1C 6.8 (H) 02/17/2021   HGBA1C 6.6 (H) 08/24/2020  A/P:  hopefully stable- update A1c today. Continue current meds for now   #hyperlipidemia/elevated LFTs S: Medication: currently on atorvastatin 40 mg daily- no issues reported -LFTs elevated in the past but recently had been well controlled -Mild poor control on this but cautious to increase Lab Results  Component Value Date   CHOL 201 (H) 02/17/2021  HDL 55.80 02/17/2021   LDLCALC 124 (H) 02/17/2021   LDLDIRECT 100.0 03/30/2021   TRIG 106.0 02/17/2021   CHOLHDL 4 02/17/2021       Latest Ref Rng & Units 08/25/2021    9:23 AM 03/30/2021    9:17 AM 02/17/2021   10:00 AM  Hepatic Function  Total Protein 6.0 - 8.3 g/dL 7.2  7.2  7.0   Albumin 3.5 - 5.2 g/dL 4.5  4.2  4.4   AST 0 - 37 U/L _0 ALT 0 - 53 U/L _1 Alk Phosphatase 39 - 117 U/L 62  60  62   Total Bilirubin 0.2 - 1.2 mg/dL 0.5  0.7  0.6   Bilirubin, Direct 0.0 - 0.3 mg/dL  0.2     A/P: In light of prior LFT elevations-as long as close to 100 LDL likely continue current medications-update lipid with labs today  #Goiter--continue to trend TSH at least annually  Lab Results  Component Value Date   TSH 1.89 02/17/2021   #Mild leukopenia-mild and rest of cell lines normal-monitor at least annually  Lab Results  Component Value Date   WBC 3.9 (L) 08/25/2021   HGB 15.4 08/25/2021   HCT 46.6 08/25/2021   MCV 89.7 08/25/2021   PLT 191.0 08/25/2021   Recommended follow up: Return  in about 6 months (around 08/27/2022) for followup or sooner if needed.Schedule b4 you leave.  Lab/Order associations: fasting   ICD-10-CM   1. Preventative health care  Z00.00     2. Well controlled diabetes mellitus ([place])  E11.9     3. Goiter  E04.9     4. Screening for prostate cancer  Z12.5       No orders of the defined types were placed in this encounter.   Return precautions advised.  [name], MD

## 2022-02-24 NOTE — Patient Instructions (Addendum)
Flu shot- we should have these available within a month or two but please let us know if you get at outside pharmacy  Please stop by lab before you go If you have mychart- we will send your results within 3 business days of [place] receiving them.  If you do not have mychart- we will call you about results within 5 business days of [place] receiving them.  *please also note that you will see labs on mychart as soon as they post. I will later go in and write notes on them- will say "notes from Dr. Yong Channel"   Recommended follow up: Return in about 6 months (around 08/27/2022) for followup or sooner if needed.Schedule b4 you leave.

## 2022-03-07 LAB — HM DIABETES EYE EXAM

## 2022-03-13 ENCOUNTER — Other Ambulatory Visit: Payer: Self-pay | Admitting: Family Medicine

## 2022-04-18 ENCOUNTER — Encounter: Payer: Self-pay | Admitting: *Deleted

## 2022-06-10 ENCOUNTER — Other Ambulatory Visit: Payer: Self-pay | Admitting: Family Medicine

## 2022-08-29 ENCOUNTER — Ambulatory Visit: Payer: 59 | Admitting: Family Medicine

## 2022-08-30 ENCOUNTER — Ambulatory Visit: Payer: 59 | Admitting: Family Medicine

## 2022-08-30 ENCOUNTER — Encounter: Payer: Self-pay | Admitting: Family Medicine

## 2022-08-30 VITALS — BP 110/60 | HR 63 | Temp 97.2°F | Ht 71.0 in | Wt 194.4 lb

## 2022-08-30 DIAGNOSIS — E1169 Type 2 diabetes mellitus with other specified complication: Secondary | ICD-10-CM

## 2022-08-30 DIAGNOSIS — E119 Type 2 diabetes mellitus without complications: Secondary | ICD-10-CM | POA: Diagnosis not present

## 2022-08-30 DIAGNOSIS — E785 Hyperlipidemia, unspecified: Secondary | ICD-10-CM

## 2022-08-30 LAB — COMPREHENSIVE METABOLIC PANEL
ALT: 19 U/L (ref 0–53)
AST: 16 U/L (ref 0–37)
Albumin: 4.9 g/dL (ref 3.5–5.2)
Alkaline Phosphatase: 63 U/L (ref 39–117)
BUN: 14 mg/dL (ref 6–23)
CO2: 21 mEq/L (ref 19–32)
Calcium: 9.9 mg/dL (ref 8.4–10.5)
Chloride: 105 mEq/L (ref 96–112)
Creatinine, Ser: 1.07 mg/dL (ref 0.40–1.50)
GFR: 72.89 mL/min (ref >60.00)
Glucose, Bld: 123 mg/dL — ABNORMAL HIGH (ref 70–99)
Potassium: 3.9 mEq/L (ref 3.5–5.1)
Sodium: 139 mEq/L (ref 135–145)
Total Bilirubin: 0.8 mg/dL (ref 0.2–1.2)
Total Protein: 7.7 g/dL (ref 6.0–8.3)

## 2022-08-30 LAB — LDL CHOLESTEROL, DIRECT: Direct LDL: 152 mg/dL

## 2022-08-30 NOTE — Patient Instructions (Addendum)
Please stop by lab before you go If you have mychart- we will send your results within 3 business days of [place] receiving them.  If you do not have mychart- we will call you about results within 5 business days of [place] receiving them.  *please also note that you will see labs on mychart as soon as they post. I will later go in and write notes on them- will say "notes from Dr. Yong Channel"   Recommended follow up: Return in about 6 months (around 02/28/2023) for physical or sooner if needed.Schedule b4 you leave.

## 2022-08-30 NOTE — Progress Notes (Signed)
Phone [phone] In person visit   Subjective:   Juan Bennett is a 67 y.o. year old very pleasant male patient who presents for/with See problem oriented charting Chief Complaint  Patient presents with   Follow-up   Diabetes   Hyperlipidemia   Past Medical History-  Patient Active Problem List   Diagnosis Date Noted   Well controlled diabetes mellitus ([place]) 09/04/2007    Priority: High   History of adenomatous polyp of colon 04/20/2018    Priority: Medium    Elevated LFTs 12/08/2014    Priority: Medium    Goiter 10/15/2009    Priority: Medium    Hyperlipidemia associated with type 2 diabetes mellitus ([place]) 09/04/2007    Priority: Medium    Cataracts, both eyes 02/11/2019    Priority: Low   Astigmatism with presbyopia, bilateral 02/11/2019    Priority: Low   Jock itch 07/30/2014    Priority: Low   Allergic rhinitis 09/04/2007    Priority: Low   Right shoulder pain 10/10/2019    Medications- reviewed and updated Current Outpatient Medications  Medication Sig Dispense Refill   aspirin 325 MG tablet Take 325 mg by mouth daily.     atorvastatin (LIPITOR) 40 MG tablet TAKE 1 TABLET BY MOUTH  DAILY 120 tablet 2   empagliflozin (JARDIANCE) 10 MG TABS tablet TAKE 1 TABLET BY MOUTH DAILY 90 tablet 1   glimepiride (AMARYL) 2 MG tablet TAKE 1 TABLET BY MOUTH  DAILY WITH BREAKFAST 120 tablet 2   metFORMIN (GLUCOPHAGE) 1000 MG tablet TAKE 1 TABLET BY MOUTH  TWICE DAILY WITH A MEAL 240 tablet 2   No current facility-administered medications for this visit.     Objective:  BP 110/60   Pulse 63   Temp (!) 97.2 F (36.2 C)   Ht '5\' 11"'$  (1.803 m)   Wt 194 lb 6.4 oz (88.2 kg)   SpO2 98%   BMI 27.11 kg/m  Gen: NAD, resting comfortably CV: RRR no murmurs rubs or gallops Lungs: CTAB no crackles, wheeze, rhonchi Ext: no edema Skin: warm, dry     Assessment and Plan    # Social update-still mourning loss of father in July 2023-holidays were more difficult but did  reasonably well   # Diabetes-A1c typically under 7 S: Medication:Jardiance 10 mg daily, metformin 1000 mg twice daily, and Glimepride 2 mg daily CBGs- doesn't check. No low blood sugars Exercise and diet- down 3 lbs despite the holidays- doesn't weigh at home.  Walking slightly more up to twice a week- encouraged to continue to increase. Also mentioned strength training Lab Results  Component Value Date   HGBA1C 7.1 (H) 02/24/2022   HGBA1C 6.8 (H) 08/25/2021   HGBA1C 6.8 (H) 02/17/2021  A/P: a1c last visit mildly high-update with labs- continue current medications for now  #hyperlipidemia/elevated LFTs S: Medication: currently on atorvastatin 40 mg daily- no issues reported -LFTs elevated in the past but recently had been well controlled -Mild poor control on this Lab Results  Component Value Date   CHOL 189 02/24/2022   HDL 62.70 02/24/2022   LDLCALC 105 (H) 02/24/2022   LDLDIRECT 100.0 03/30/2021   TRIG 106.0 02/24/2022   CHOLHDL 3 02/24/2022       Latest Ref Rng & Units 02/24/2022   10:45 AM 08/25/2021    9:23 AM 03/30/2021    9:17 AM  Hepatic Function  Total Protein 6.0 - 8.3 g/dL 7.3  7.2  7.2   Albumin 3.5 - 5.2 g/dL 4.6  4.5  4.2   AST 0 - 37 U/L '19  17  17   '$ ALT 0 - 53 U/L '21  19  22   '$ Alk Phosphatase 39 - 117 U/L 64  62  60   Total Bilirubin 0.2 - 1.2 mg/dL 0.6  0.5  0.7   Bilirubin, Direct 0.0 - 0.3 mg/dL   0.2    A/P: Very mild elevations in lipids-ideal would be under 70 but we want to avoid elevating LFTs again that have been controlled lately-update LFTs today and continue current medication  #Goiter--continue to trend TSH at least annually-August 2023 last check-hold off for now Lab Results  Component Value Date   TSH 1.63 02/24/2022   #screen skin cancer- march visit with [place] derm  Recommended follow up: Return in about 6 months (around 02/28/2023) for physical or sooner if needed.Schedule b4 you leave.  Lab/Order associations:   ICD-10-CM   1. Well  controlled diabetes mellitus ([place])  E11.9 Comprehensive metabolic panel    LDL cholesterol, direct    2. Hyperlipidemia associated with type 2 diabetes mellitus ([place])  E11.69    E78.5       No orders of the defined types were placed in this encounter.   Return precautions advised.  [name], MD

## 2022-09-01 ENCOUNTER — Other Ambulatory Visit: Payer: Self-pay

## 2022-09-01 ENCOUNTER — Telehealth: Payer: Self-pay | Admitting: Family Medicine

## 2022-09-01 DIAGNOSIS — E119 Type 2 diabetes mellitus without complications: Secondary | ICD-10-CM

## 2022-09-01 NOTE — Telephone Encounter (Signed)
Please call and tell him liver function looks good and only needs a1c. We could do as nurse visit for POC a1c if he wants to avoid phlebotomy

## 2022-09-01 NOTE — Telephone Encounter (Signed)
Pt is scheduled to come in for labs on 09/07/22. He is also requesting a urinalysis. He is concerned about previous liver issues. Please advise

## 2022-09-01 NOTE — Telephone Encounter (Signed)
(  FYI) CMET was performed at most recent visit and you stated it was normal.

## 2022-09-02 NOTE — Telephone Encounter (Signed)
Called and spoke with pt and below message given. 

## 2022-09-07 ENCOUNTER — Other Ambulatory Visit (INDEPENDENT_AMBULATORY_CARE_PROVIDER_SITE_OTHER): Payer: 59

## 2022-09-07 DIAGNOSIS — E119 Type 2 diabetes mellitus without complications: Secondary | ICD-10-CM | POA: Diagnosis not present

## 2022-09-07 LAB — HEMOGLOBIN A1C: Hgb A1c MFr Bld: 7 % — ABNORMAL HIGH (ref 4.6–6.5)

## 2022-12-13 ENCOUNTER — Other Ambulatory Visit: Payer: Self-pay

## 2022-12-13 MED ORDER — EMPAGLIFLOZIN 10 MG PO TABS: 10.0000 mg | ORAL_TABLET | Freq: Every day | ORAL | 3 refills | Status: DC

## 2022-12-15 ENCOUNTER — Other Ambulatory Visit: Payer: Self-pay | Admitting: Family Medicine

## 2022-12-15 DIAGNOSIS — E785 Hyperlipidemia, unspecified: Secondary | ICD-10-CM

## 2022-12-25 ENCOUNTER — Other Ambulatory Visit: Payer: Self-pay | Admitting: Family Medicine

## 2023-01-25 ENCOUNTER — Telehealth: Payer: Self-pay | Admitting: Family Medicine

## 2023-01-25 ENCOUNTER — Telehealth (INDEPENDENT_AMBULATORY_CARE_PROVIDER_SITE_OTHER): Payer: 59 | Admitting: Family

## 2023-01-25 VITALS — Ht 71.0 in | Wt 195.0 lb

## 2023-01-25 DIAGNOSIS — U071 COVID-19: Secondary | ICD-10-CM | POA: Diagnosis not present

## 2023-01-25 MED ORDER — NIRMATRELVIR/RITONAVIR (PAXLOVID)TABLET
3.0000 | ORAL_TABLET | Freq: Two times a day (BID) | ORAL | 0 refills | Status: AC
Start: 2023-01-25 — End: 2023-01-30

## 2023-01-25 NOTE — Telephone Encounter (Signed)
Covid+, will call to set up vv with available provider   Patient Name First: Juan Last: Bennett Gender: Male DOB: Nov 24, 1955 Age: 67 Y 9 M 13 D Return Phone Number: [phone] (Primary), [phone] (Secondary) Address: City/ State/ Zip: [place] [facility] - Human resources [facility] [employer] Provider Tana Conch- MD Contact Type Call Who Is Calling Patient / Member / Family / Caregiver Call Type Triage / Clinical Relationship To Patient Self Return Phone Number [phone] (Primary) Chief Complaint Sore Throat Reason for Call Symptomatic / Request for Health Information Initial Comment Caller states he states he has sore throat. He has a headache. He also has a cough. He tested postive Covid Translation No Nurse Assessment Nurse: Marletta Lor, RN, Rosey Bath Date/Time Lamount Cohen Time): 01/24/2023 6:24:26 PM Confirm and document reason for call. If symptomatic, describe symptoms. ---Caller states tested positive for Covid now. Onset HA, body aches, cough, sore throat last night - worse today. Currently on vacation - drove - in [place]. Does the patient have any new or worsening symptoms? ---Yes Will a triage be completed? ---Yes Related visit to physician within the last 2 weeks? ---No Does the PT have any chronic conditions? (i.e. diabetes, asthma, this includes High risk factors for pregnancy, etc.) ---Yes List chronic conditions. ---DM / cholesterol Is this a behavioral health or substance abuse call? ---No Guidelines Guideline Title Affirmed Question Affirmed Notes Nurse Date/Time (Eastern Time) COVID-19 - Diagnosed or Suspected [1] HIGH RISK patient (e.g., weak immune system, age > 64 years, obesity with BMI 30 or higher, pregnant, Marletta Lor, RN, Rosey Bath 01/24/2023 6:26:23 PM Guidelines Guideline Title Affirmed Question Affirmed Notes Nurse Date/Time (Eastern Time) chronic lung  disease or other chronic medical condition) AND [2] COVID symptoms (e.g., cough, fever) (Exceptions: Already seen by PCP and no new or worsening symptoms.) Disp. Time Lamount Cohen Time) Disposition Final User 01/24/2023 6:31:11 PM Call PCP within 24 Hours Yes Marletta Lor RN, Rosey Bath Final Disposition 01/24/2023 6:31:11 PM Call PCP within 24 Hours Yes Marletta Lor, RN, Suezanne Cheshire Disagree/Comply Comply Caller Understands Yes PreDisposition Call Doctor Care Advice Given Per Guideline CALL PCP WITHIN 24 HOURS: * You need to discuss this with your doctor (or NP/PA) within the next 24 hours. * IF OFFICE WILL BE OPEN: Call the office when it opens tomorrow morning. CARE ADVICE given per COVID-19 - DIAGNOSED OR SUSPECTED (Adult) guideline. CALL BACK IF: * You become worse * WASH HANDS OFTEN: Wash hands often with soap and water. After coughing or sneezing are important times. If soap and water are not available, use an alcohol-based hand sanitizer with at least 60% alcohol, covering all surfaces of your hands and rubbing them together until they feel dry. Avoid touching your eyes, nose, and mouth with unwashed hands. FEVER MEDICINES - EXTRA NOTES AND WARNINGS: COVID-19 - HOW TO PROTECT OTHERS - WHEN YOU ARE SICK WITH COVID-19: Comments User: Desiree Hane, RN Date/Time (Eastern Time): 01/24/2023 6:32:13 PM [place] beach [place] - quarantining from wife - using maks / cough drops as needed / pain meds Referrals REFERRED TO PCP OFFICE

## 2023-01-25 NOTE — Progress Notes (Signed)
MyChart Video Visit    Virtual Visit via Video Note   This format is felt to be most appropriate for this patient at this time. Physical exam was limited by quality of the video and audio technology used for the visit. CMA was able to get the patient set up on a video visit.  Patient location: Home. Patient and provider in visit Provider location: Office  I discussed the limitations of evaluation and management by telemedicine and the availability of in person appointments. The patient expressed understanding and agreed to proceed.  Visit Date: 01/25/2023  Today's healthcare provider: Dulce Sellar, NP     Subjective:   Patient ID: Juan Bennett, male    DOB: 11-Mar-1956, 67 y.o.   MRN: [id number]  Chief Complaint  Patient presents with   Covid Positive    sx for 2d    HPI Covid sx:  Pt test positive yesterday at home, sx sore throat, coughing, runny nose, chills. Sx started 7/1 but more consistent on 7/2. Has tried cough drops. Pt had covid before and did well with the antiviral. Denies fever or body aches.   Assessment & Plan:  COVID-19 - Sending Paxlovid/Molnupiravir, pt advised of FDA  approval, how to take, & SE. advised to hold his Lipitor for 6 days, then resume. Advised of CDC guidelines for masking if out in public. OK to continue taking OTC sinus or pain meds. Encouraged to monitor & notify office of any worsening symptoms: increased shortness of breath, weakness, and signs of dehydration. Instructed to rest and hydrate well.   -     nirmatrelvir/ritonavir; Take 3 tablets by mouth 2 (two) times daily for 5 days. (Take nirmatrelvir 150 mg two tablets twice daily for 5 days and ritonavir 100 mg one tablet twice daily for 5 days) Patient GFR is 77  Dispense: 30 tablet; Refill: 0    Past Medical History:  Diagnosis Date   Allergy    Astigmatism with presbyopia, bilateral 02/11/2019   Cataracts, both eyes 02/11/2019   Diabetes mellitus    Hyperlipidemia      Past Surgical History:  Procedure Laterality Date   COLONOSCOPY  10/26/2007   w/Brodie   WISDOM TOOTH EXTRACTION      Outpatient Medications Prior to Visit  Medication Sig Dispense Refill   aspirin 325 MG tablet Take 325 mg by mouth daily.     atorvastatin (LIPITOR) 40 MG tablet TAKE 1 TABLET BY MOUTH DAILY 120 tablet 2   empagliflozin (JARDIANCE) 10 MG TABS tablet Take 1 tablet (10 mg total) by mouth daily. 90 tablet 3   glimepiride (AMARYL) 2 MG tablet TAKE 1 TABLET BY MOUTH DAILY  WITH BREAKFAST 120 tablet 2   metFORMIN (GLUCOPHAGE) 1000 MG tablet TAKE 1 TABLET BY MOUTH TWICE  DAILY WITH A MEAL 240 tablet 2   No facility-administered medications prior to visit.    No Known Allergies     Objective:   Physical Exam Vitals and nursing note reviewed.  Constitutional:      General: Pt is not in acute distress.    Appearance: Normal appearance.  HENT:     Head: Normocephalic.  Pulmonary:     Effort: No respiratory distress.  Musculoskeletal:     Cervical back: Normal range of motion.  Skin:    General: Skin is dry.     Coloration: Skin is not pale.  Neurological:     Mental Status: Pt is alert and oriented to person, place, and time.  Psychiatric:        Mood and Affect: Mood normal.   Ht 5\' 11"  (1.803 m)   Wt 195 lb (88.5 kg)   BMI 27.20 kg/m   Wt Readings from Last 3 Encounters:  01/25/23 195 lb (88.5 kg)  08/30/22 194 lb 6.4 oz (88.2 kg)  02/24/22 197 lb 9.6 oz (89.6 kg)        I discussed the assessment and treatment plan with the patient. The patient was provided an opportunity to ask questions and all were answered. The patient agreed with the plan and demonstrated an understanding of the instructions.   The patient was advised to call back or seek an in-person evaluation if the symptoms worsen or if the condition fails to improve as anticipated.  Dulce Sellar, NP [facility] [phone] (phone) [phone]  (fax)  [facility]

## 2023-03-09 ENCOUNTER — Encounter (INDEPENDENT_AMBULATORY_CARE_PROVIDER_SITE_OTHER): Payer: Self-pay

## 2023-04-04 ENCOUNTER — Encounter: Payer: Self-pay | Admitting: Family Medicine

## 2023-04-04 ENCOUNTER — Ambulatory Visit (INDEPENDENT_AMBULATORY_CARE_PROVIDER_SITE_OTHER): Payer: Medicare Other | Admitting: Family Medicine

## 2023-04-04 VITALS — BP 134/60 | HR 67 | Temp 98.2°F | Ht 71.0 in | Wt 192.2 lb

## 2023-04-04 DIAGNOSIS — Z125 Encounter for screening for malignant neoplasm of prostate: Secondary | ICD-10-CM | POA: Diagnosis not present

## 2023-04-04 DIAGNOSIS — E1169 Type 2 diabetes mellitus with other specified complication: Secondary | ICD-10-CM | POA: Diagnosis not present

## 2023-04-04 DIAGNOSIS — Z0000 Encounter for general adult medical examination without abnormal findings: Secondary | ICD-10-CM

## 2023-04-04 DIAGNOSIS — Z23 Encounter for immunization: Secondary | ICD-10-CM | POA: Diagnosis not present

## 2023-04-04 DIAGNOSIS — Z1211 Encounter for screening for malignant neoplasm of colon: Secondary | ICD-10-CM

## 2023-04-04 DIAGNOSIS — E785 Hyperlipidemia, unspecified: Secondary | ICD-10-CM

## 2023-04-04 DIAGNOSIS — E119 Type 2 diabetes mellitus without complications: Secondary | ICD-10-CM

## 2023-04-04 DIAGNOSIS — Z7984 Long term (current) use of oral hypoglycemic drugs: Secondary | ICD-10-CM

## 2023-04-04 LAB — CBC WITH DIFFERENTIAL/PLATELET
Basophils Absolute: 0.1 10*3/uL (ref 0.0–0.1)
Basophils Relative: 1.1 % (ref 0.0–3.0)
Eosinophils Absolute: 0.1 10*3/uL (ref 0.0–0.7)
Eosinophils Relative: 2.7 % (ref 0.0–5.0)
HCT: 45.9 % (ref 39.0–52.0)
Hemoglobin: 14.9 g/dL (ref 13.0–17.0)
Lymphocytes Relative: 24 % (ref 12.0–46.0)
Lymphs Abs: 1.1 10*3/uL (ref 0.7–4.0)
MCHC: 32.5 g/dL (ref 30.0–36.0)
MCV: 90.6 fl (ref 78.0–100.0)
Monocytes Absolute: 0.6 10*3/uL (ref 0.1–1.0)
Monocytes Relative: 12.1 % — ABNORMAL HIGH (ref 3.0–12.0)
Neutro Abs: 2.8 10*3/uL (ref 1.4–7.7)
Neutrophils Relative %: 60.1 % (ref 43.0–77.0)
Platelets: 215 10*3/uL (ref 150.0–400.0)
RBC: 5.07 Mil/uL (ref 4.22–5.81)
RDW: 13.8 % (ref 11.5–15.5)
WBC: 4.7 10*3/uL (ref 4.0–10.5)

## 2023-04-04 LAB — COMPREHENSIVE METABOLIC PANEL
ALT: 21 U/L (ref 0–53)
AST: 20 U/L (ref 0–37)
Albumin: 4.2 g/dL (ref 3.5–5.2)
Alkaline Phosphatase: 67 U/L (ref 39–117)
BUN: 14 mg/dL (ref 6–23)
CO2: 25 meq/L (ref 19–32)
Calcium: 9.4 mg/dL (ref 8.4–10.5)
Chloride: 104 meq/L (ref 96–112)
Creatinine, Ser: 0.91 mg/dL (ref 0.40–1.50)
GFR: 88.16 mL/min (ref >60.00)
Glucose, Bld: 94 mg/dL (ref 70–99)
Potassium: 3.9 meq/L (ref 3.5–5.1)
Sodium: 137 meq/L (ref 135–145)
Total Bilirubin: 0.7 mg/dL (ref 0.2–1.2)
Total Protein: 7.1 g/dL (ref 6.0–8.3)

## 2023-04-04 LAB — PSA, MEDICARE: PSA: 1.29 ng/mL (ref 0.10–4.00)

## 2023-04-04 LAB — MICROALBUMIN / CREATININE URINE RATIO
Creatinine,U: 79.7 mg/dL
Microalb Creat Ratio: 1.2 mg/g (ref 0.0–30.0)
Microalb, Ur: 1 mg/dL (ref 0.0–1.9)

## 2023-04-04 LAB — LIPID PANEL
Cholesterol: 190 mg/dL (ref 0–200)
HDL: 64.2 mg/dL (ref >39.00)
LDL Cholesterol: 106 mg/dL — ABNORMAL HIGH (ref 0–99)
NonHDL: 125.75
Total CHOL/HDL Ratio: 3
Triglycerides: 97 mg/dL (ref 0.0–149.0)
VLDL: 19.4 mg/dL (ref 0.0–40.0)

## 2023-04-04 LAB — HEMOGLOBIN A1C: Hgb A1c MFr Bld: 7 % — ABNORMAL HIGH (ref 4.6–6.5)

## 2023-04-04 LAB — TSH: TSH: 2.12 u[IU]/mL (ref 0.35–5.50)

## 2023-04-04 NOTE — Progress Notes (Signed)
Phone: [phone]   Subjective:  Patient presents today for their annual physical. Chief complaint-noted.   See problem oriented charting- ROS- full  review of systems was completed and negative  except for: some popping in left knee more prominent - some pain when that happens  The following were reviewed and entered/updated in epic: Past Medical History:  Diagnosis Date   Allergy    Astigmatism with presbyopia, bilateral 02/11/2019   Cataracts, both eyes 02/11/2019   Diabetes mellitus    Hyperlipidemia    Patient Active Problem List   Diagnosis Date Noted   Well controlled diabetes mellitus ([facility]) 09/04/2007    Priority: High   History of adenomatous polyp of colon 04/20/2018    Priority: Medium    Elevated LFTs 12/08/2014    Priority: Medium    Goiter 10/15/2009    Priority: Medium    Hyperlipidemia associated with type 2 diabetes mellitus ([facility]) 09/04/2007    Priority: Medium    Cataracts, both eyes 02/11/2019    Priority: Low   Astigmatism with presbyopia, bilateral 02/11/2019    Priority: Low   Jock itch 07/30/2014    Priority: Low   Allergic rhinitis 09/04/2007    Priority: Low   Right shoulder pain 10/10/2019   Past Surgical History:  Procedure Laterality Date   COLONOSCOPY  10/26/2007   w/Brodie   WISDOM TOOTH EXTRACTION      Family History  Problem Relation Age of Onset   Diabetes Mother    Dementia Mother        fall, hip fracture, she didnt improve   Deep vein thrombosis Father    Diabetes Paternal Uncle    Colon cancer Neg Hx    Esophageal cancer Neg Hx    Stomach cancer Neg Hx    Rectal cancer Neg Hx    Colon polyps Neg Hx     Medications- reviewed and updated Current Outpatient Medications  Medication Sig Dispense Refill   aspirin 325 MG tablet Take 325 mg by mouth daily.     atorvastatin (LIPITOR) 40 MG tablet TAKE 1 TABLET BY MOUTH DAILY 120 tablet 2   empagliflozin (JARDIANCE) 10 MG TABS tablet Take 1 tablet (10 mg total) by mouth  daily. 90 tablet 3   glimepiride (AMARYL) 2 MG tablet TAKE 1 TABLET BY MOUTH DAILY  WITH BREAKFAST 120 tablet 2   metFORMIN (GLUCOPHAGE) 1000 MG tablet TAKE 1 TABLET BY MOUTH TWICE  DAILY WITH A MEAL 240 tablet 2   No current facility-administered medications for this visit.    Allergies-reviewed and updated No Known Allergies  Social History   Social History Narrative   Married since 24. Daughter [employer] grad as of may 2020- . Retired age 30.       Work at [employer] long time.       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 Oropharynx normal Neck: no thyromegaly CV: RRR no murmurs rubs or gallops Lungs: CTAB no crackles, wheeze, rhonchi Abdomen: soft/nontender/nondistended/normal bowel sounds. No rebound or guarding.  Ext: no edema Skin: warm, dry Neuro: grossly normal, moves all extremities, PERRLA Some crepitus with left knee exam  but good range of motion and stable ligaments- normal meniscus testing   Diabetic Foot Exam - Simple   Simple Foot Form Diabetic Foot exam was performed with the following findings: Yes 04/04/2023 10:01 AM  Visual Inspection No deformities, no ulcerations, no other skin breakdown bilaterally: Yes Sensation Testing Intact to touch and monofilament testing bilaterally: Yes Pulse Check Posterior Tibialis and Dorsalis pulse intact bilaterally: Yes Comments Slight callous base of 5th MTP joint         Assessment and Plan  67 y.o. male presenting for annual physical.  Health Maintenance counseling: 1. Anticipatory guidance: Patient counseled regarding regular dental exams -q6 months, eye exams -yearly,  avoiding smoking and second hand smoke , limiting alcohol to 2  beverages per day - 5-6 a week, no illicit drugs .   2. Risk factor reduction:  Advised patient of need for regular exercise and diet rich and fruits and vegetables to reduce risk of heart attack and stroke.  Exercise- some walking but would prefer daily. Mentioned listening to music or trying trails.  Diet/weight management-down 5 lbs in last year.  Wt Readings from Last 3 Encounters:  04/04/23 192 lb 3.2 oz (87.2 kg)  01/25/23 195 lb (88.5 kg)  08/30/22 194 lb 6.4 oz (88.2 kg)  3. Immunizations/screenings/ancillary studies-just had COVID in July- not as bad as previous- perhaps wait 3-6 months, flu shot today, otherwise up-to-date  Immunization History  Administered Date(s) Administered   Fluad Quad(high Dose 65+) 04/18/2022   Fluad Trivalent(High Dose 65+) 04/04/2023   Hep A / Hep B 07/17/2015, 08/17/2015, 01/15/2016   Influenza Whole 05/25/2009   Influenza,inj,Quad PF,6+ Mos 04/23/2015, 05/17/2017, 05/12/2018, 04/06/2021   Influenza-Unspecified 06/08/2014, 03/28/2016, 05/12/2018, 04/09/2019, 06/10/2020   PFIZER(Purple Top)SARS-COV-2 Vaccination 08/04/2019, 08/24/2019, 05/20/2020, 10/29/2020   Pfizer Covid-19 Vaccine Bivalent Booster 66yrs & up 04/18/2022   Pneumococcal Conjugate-13 09/29/2008   Pneumococcal Polysaccharide-23 09/29/2008, 02/20/2014   Td 08/25/2006   Tdap 09/08/2016   Zoster Recombinant(Shingrix) 01/17/2017, 03/20/2017  4. Prostate cancer screening-  trend PSA- low risk previously Lab Results  Component Value Date   PSA 0.73 02/24/2022   PSA 0.72 02/17/2021   PSA 0.54 02/05/2020   5. Colon cancer screening - April 16, 2018 with 5-year repeat planned- refer today 6. Skin cancer screening-saw dermatology after last visit and even groin spot did not need biopsy- was told follow up only as needed. advised regular sunscreen use. Denies worrisome, changing, or new skin lesions 7. Smoking associated screening (lung cancer screening, AAA screen 65-75, UA)-never  smoker 8. STD screening - declines-only active with wife  Status of chronic or acute concerns    # Diabetes-A1c typically under 7 S: Medication:Jardiance 10 mg daily, metformin 1000 mg twice daily, and Glimepride 2 mg daily CBGs- doesn't check Lab Results  Component Value Date   HGBA1C 7.0 (H) 09/07/2022   HGBA1C 7.1 (H) 02/24/2022   HGBA1C 6.8 (H) 08/25/2021  A/P: hopefully stable or improved- update a1c today. Continue current meds for now   #hyperlipidemia/elevated LFTs S: Medication: currently on atorvastatin 40 mg daily- no issues reported -LFTs elevated in the past but recently had been well controlled -Mild poor control on this Lab Results  Component Value Date   CHOL 189 02/24/2022   HDL 62.70 02/24/2022   LDLCALC 105 (H) 02/24/2022   LDLDIRECT 152.0 08/30/2022   TRIG 106.0 02/24/2022   CHOLHDL 3 02/24/2022  A/P: cholesterl above ideal goal last visit- hopefully improved- update lipid panel today -last LFT normal  #Goiter--continue  to trend TSH at least annually-August 2023 last check and will check today  #Mild leukopenia-mild and rest of cell lines normal-monitor at least annually but last visit was normal- continue to monitor today  Lab Results  Component Value Date   WBC 4.5 02/24/2022   HGB 15.5 02/24/2022   HCT 47.1 02/24/2022   MCV 90.3 02/24/2022   PLT 187.0 02/24/2022   #Intermittent knee pain and popping- discussed likely some arthritic changes- can trial diclofenac/Voltaren gel OTC (available over the counter without a prescription)   Recommended follow up: Return in about 6 months (around 10/02/2023) for followup or sooner if needed.Schedule b4 you leave.  Lab/Order associations: fasting   ICD-10-CM   1. Preventative health care  Z00.00     2. Encounter for immunization  Z23 Flu Vaccine Trivalent High Dose (Fluad)    3. Well controlled diabetes mellitus (HCC)  E11.9 Comprehensive metabolic panel    CBC with Differential/Platelet    Lipid panel     Microalbumin / creatinine urine ratio    4. Hyperlipidemia associated with type 2 diabetes mellitus (HCC)  E11.69 TSH   E78.5     5. Screening for prostate cancer  Z12.5 PSA, Medicare    6. Screen for colon cancer  Z12.11 Ambulatory referral to Gastroenterology     No orders of the defined types were placed in this encounter.  Return precautions advised.  Tana Conch, MD

## 2023-04-04 NOTE — Patient Instructions (Addendum)
#  Intermittent knee pain and popping- discussed likely some arthritic changes- can trial diclofenac/Voltaren gel OTC (available over the counter without a prescription)   In the next 20 days you plan to walk 30 minutes five days a week. I will set a reminder to check on you on this in next few months  [facility] contact Please call to schedule visit and/or procedure Address: [address] [place], [place], [place] [address] Phone: [phone]   Please stop by lab before you go If you have mychart- we will send your results within 3 business days of [place] receiving them.  If you do not have mychart- we will call you about results within 5 business days of [place] receiving them.  *please also note that you will see labs on mychart as soon as they post. I will later go in and write notes on them- will say "notes from Dr. Durene Cal"   Recommended follow up: Return in about 6 months (around 10/02/2023) for followup or sooner if needed.Schedule b4 you leave.

## 2023-04-05 ENCOUNTER — Other Ambulatory Visit: Payer: Self-pay

## 2023-04-05 MED ORDER — ROSUVASTATIN CALCIUM 40 MG PO TABS: 40.0000 mg | ORAL_TABLET | Freq: Every day | ORAL | 3 refills | Status: DC

## 2023-04-14 ENCOUNTER — Encounter: Payer: Self-pay | Admitting: Gastroenterology

## 2023-04-18 LAB — HM DIABETES EYE EXAM

## 2023-04-26 ENCOUNTER — Encounter: Payer: Self-pay | Admitting: Family Medicine

## 2023-05-01 ENCOUNTER — Ambulatory Visit (AMBULATORY_SURGERY_CENTER): Payer: Medicare Other | Admitting: *Deleted

## 2023-05-01 ENCOUNTER — Other Ambulatory Visit (HOSPITAL_COMMUNITY): Payer: Self-pay

## 2023-05-01 VITALS — Ht 71.0 in | Wt 195.0 lb

## 2023-05-01 DIAGNOSIS — Z860100 Personal history of colon polyps, unspecified: Secondary | ICD-10-CM

## 2023-05-01 MED ORDER — NA SULFATE-K SULFATE-MG SULF 17.5-3.13-1.6 GM/177ML PO SOLN
1.0000 | Freq: Once | ORAL | 0 refills | Status: AC
Start: 2023-05-01 — End: 2023-05-01

## 2023-05-01 MED ORDER — COVID-19 MRNA VAC-TRIS(PFIZER) 30 MCG/0.3ML IM SUSY
0.3000 mL | PREFILLED_SYRINGE | Freq: Once | INTRAMUSCULAR | 0 refills | Status: AC
  Filled 2023-05-01: qty 0.3, 1d supply, fill #0

## 2023-05-01 NOTE — Progress Notes (Signed)
Pt's name and DOB verified at the beginning of the pre-visit wit 2 identifiers  Pt denies any difficulty with ambulating,sitting, laying down or rolling side to side  Gave both [facility] main # and MD on call # prior to instructions.   No egg or soy allergy known to patient   No issues known to pt with past sedation with any surgeries or procedures  Patient denies ever being intubated  Pt has no issues moving head neck or swallowing  No FH of Malignant Hyperthermia  Pt is not on diet pills or shots  Pt is not on home 02   Pt is not on blood thinners   Pt denies issues with constipation   Pt is not on dialysis  Pt denise any abnormal heart rhythms   Pt denies any upcoming cardiac testing  Pt encouraged to use to use Singlecare or Goodrx to reduce cost   Patient's chart reviewed by Juan Bennett CNRA prior to pre-visit and patient appropriate for the [facility].  Pre-visit completed and red dot placed by patient's name on their procedure day (on provider's schedule).  .  Visit by phone  Pt states weight is 195 lb  Instructed pt why it is important to and  to call if they have any changes in health or new medications. Directed them to the # given and on instructions.     Instructions reviewed with pt and pt states understanding. Instructed to review again prior to procedure. Pt states they will.   Instructions sent by mail with coupon and by my chart

## 2023-05-16 ENCOUNTER — Encounter: Payer: Self-pay | Admitting: Gastroenterology

## 2023-05-29 ENCOUNTER — Ambulatory Visit (AMBULATORY_SURGERY_CENTER): Payer: Medicare Other | Admitting: Gastroenterology

## 2023-05-29 ENCOUNTER — Encounter: Payer: Self-pay | Admitting: Gastroenterology

## 2023-05-29 VITALS — BP 108/69 | HR 59 | Temp 98.2°F | Resp 16 | Ht 71.0 in | Wt 195.0 lb

## 2023-05-29 DIAGNOSIS — Z1211 Encounter for screening for malignant neoplasm of colon: Secondary | ICD-10-CM

## 2023-05-29 DIAGNOSIS — Z09 Encounter for follow-up examination after completed treatment for conditions other than malignant neoplasm: Secondary | ICD-10-CM

## 2023-05-29 DIAGNOSIS — Z860100 Personal history of colon polyps, unspecified: Secondary | ICD-10-CM

## 2023-05-29 DIAGNOSIS — Z860101 Personal history of adenomatous and serrated colon polyps: Secondary | ICD-10-CM

## 2023-05-29 MED ORDER — SODIUM CHLORIDE 0.9 % IV SOLN: 500.0000 mL | INTRAVENOUS | Status: AC

## 2023-05-29 MED ORDER — DEXTROSE 5 % IV SOLN: INTRAVENOUS | Status: AC

## 2023-05-29 NOTE — Progress Notes (Signed)
History and Physical:  This patient presents for endoscopic testing for: Encounter Diagnosis  Name Primary?   History of colonic polyps Yes    Surveillance colonoscopy today. SubCM ICV SSP  Sept 2019 Patient denies chronic abdominal pain, rectal bleeding, constipation or diarrhea. Pre-procedure glucose 66 today - D5W running   Patient is otherwise without complaints or active issues today.   Past Medical History: Past Medical History:  Diagnosis Date   Allergy    Astigmatism with presbyopia, bilateral 02/11/2019   Cataracts, both eyes 02/11/2019   Diabetes mellitus    Hyperlipidemia      Past Surgical History: Past Surgical History:  Procedure Laterality Date   COLONOSCOPY  10/26/2007   w/Brodie   WISDOM TOOTH EXTRACTION      Allergies: No Known Allergies  Outpatient Meds: Current Outpatient Medications  Medication Sig Dispense Refill   aspirin 325 MG tablet Take 325 mg by mouth daily.     empagliflozin (JARDIANCE) 10 MG TABS tablet Take 1 tablet (10 mg total) by mouth daily. 90 tablet 3   glimepiride (AMARYL) 2 MG tablet TAKE 1 TABLET BY MOUTH DAILY  WITH BREAKFAST 120 tablet 2   metFORMIN (GLUCOPHAGE) 1000 MG tablet TAKE 1 TABLET BY MOUTH TWICE  DAILY WITH A MEAL 240 tablet 2   rosuvastatin (CRESTOR) 40 MG tablet Take 1 tablet (40 mg total) by mouth daily. 90 tablet 3   Current Facility-Administered Medications  Medication Dose Route Frequency Provider Last Rate Last Admin   0.9 %  sodium chloride infusion  500 mL Intravenous Continuous Danis, Starr Lake III, MD          ___________________________________________________________________ Objective   Exam:  BP 121/75   Pulse 66   Temp 98.2 F (36.8 C) (Skin)   Ht 5\' 11"  (1.803 m)   Wt 195 lb (88.5 kg)   SpO2 99%   BMI 27.20 kg/m   CV: regular , S1/S2 Resp: clear to auscultation bilaterally, normal RR and effort noted GI: soft, no tenderness, with active bowel sounds.   Assessment: Encounter  Diagnosis  Name Primary?   History of colonic polyps Yes     Plan: Colonoscopy   The benefits and risks of the planned procedure were described in detail with the patient or (when appropriate) their health care proxy.  Risks were outlined as including, but not limited to, bleeding, infection, perforation, adverse medication reaction leading to cardiac or pulmonary decompensation, pancreatitis (if ERCP).  The limitation of incomplete mucosal visualization was also discussed.  No guarantees or warranties were given.  The patient is appropriate for an endoscopic procedure in the ambulatory setting.   - Amada Jupiter, MD

## 2023-05-29 NOTE — Op Note (Signed)
[facility] Patient Name: Juan Bennett Procedure Date: 05/29/2023 11:26 AM MRN: [id number] Endoscopist: Sherilyn Cooter L. Myrtie Neither , MD, [id number] Age: 67 Referring MD:  Date of Birth: 1955/08/31 Gender: Male Account #: [id number] Procedure:                Colonoscopy Indications:              High risk colon cancer surveillance: Personal                            history of sessile serrated colon polyp (less than                            10 mm in size) with no dysplasia -September 2019                           No polyps 2009 Medicines:                Monitored Anesthesia Care Procedure:                Pre-Anesthesia Assessment:                           - Prior to the procedure, a History and Physical                            was performed, and patient medications and                            allergies were reviewed. The patient's tolerance of                            previous anesthesia was also reviewed. The risks                            and benefits of the procedure and the sedation                            options and risks were discussed with the patient.                            All questions were answered, and informed consent                            was obtained. Prior Anticoagulants: The patient has                            taken no anticoagulant or antiplatelet agents. ASA                            Grade Assessment: III - A patient with severe                            systemic disease. After reviewing the risks and  benefits, the patient was deemed in satisfactory                            condition to undergo the procedure.                           After obtaining informed consent, the colonoscope                            was passed under direct vision. Throughout the                            procedure, the patient's blood pressure, pulse, and                            oxygen saturations were monitored  continuously. The                            Olympus CF-HQ190L ([id number]) Colonoscope was                            introduced through the anus and advanced to the the                            cecum, identified by appendiceal orifice and                            ileocecal valve. The colonoscopy was performed                            without difficulty. The patient tolerated the                            procedure well. The quality of the bowel                            preparation was good. The ileocecal valve,                            appendiceal orifice, and rectum were photographed. Scope In: 11:31:58 AM Scope Out: 11:48:17 AM Scope Withdrawal Time: 0 hours 12 minutes 17 seconds  Total Procedure Duration: 0 hours 16 minutes 19 seconds  Findings:                 The perianal and digital rectal examinations were                            normal.                           Repeat examination of right colon under NBI                            performed.  Internal hemorrhoids were found.                           The exam was otherwise without abnormality on                            direct and retroflexion views. Complications:            No immediate complications. Estimated Blood Loss:     Estimated blood loss: none. Impression:               - Internal hemorrhoids.                           - The examination was otherwise normal on direct                            and retroflexion views.                           - No specimens collected. Recommendation:           - Patient has a contact number available for                            emergencies. The signs and symptoms of potential                            delayed complications were discussed with the                            patient. Return to normal activities tomorrow.                            Written discharge instructions were provided to the                            patient.                            - Resume previous diet.                           - Continue present medications.                           - Repeat colonoscopy in 10 years for surveillance. Sibley Rolison L. Myrtie Neither, MD 05/29/2023 11:52:17 AM This report has been signed electronically.

## 2023-05-29 NOTE — Progress Notes (Signed)
Vss nad trans to pacu 

## 2023-05-29 NOTE — Progress Notes (Signed)
VS by DT  Pt's states no medical or surgical changes since previsit or office visit.  

## 2023-05-29 NOTE — Patient Instructions (Signed)
YOU HAD AN ENDOSCOPIC PROCEDURE TODAY AT THE [facility]:   Refer to the procedure report that was given to you for any specific questions about what was found during the examination.  If the procedure report does not answer your questions, please call your gastroenterologist to clarify.  If you requested that your care partner not be given the details of your procedure findings, then the procedure report has been included in a sealed envelope for you to review at your convenience later.  Please read handouts provided. Continue present medications. Repeat colonoscopy in 10 years for screening.  YOU SHOULD EXPECT: Some feelings of bloating in the abdomen. Passage of more gas than usual.  Walking can help get rid of the air that was put into your GI tract during the procedure and reduce the bloating. If you had a lower endoscopy (such as a colonoscopy or flexible sigmoidoscopy) you may notice spotting of blood in your stool or on the toilet paper. If you underwent a bowel prep for your procedure, you may not have a normal bowel movement for a few days.  Please Note:  You might notice some irritation and congestion in your nose or some drainage.  This is from the oxygen used during your procedure.  There is no need for concern and it should clear up in a day or so.  SYMPTOMS TO REPORT IMMEDIATELY:  Following lower endoscopy (colonoscopy or flexible sigmoidoscopy):  Excessive amounts of blood in the stool  Significant tenderness or worsening of abdominal pains  Swelling of the abdomen that is new, acute  Fever of 100F or higher.   For urgent or emergent issues, a gastroenterologist can be reached at any hour by calling [phone]. Do not use MyChart messaging for urgent concerns.    DIET:  We do recommend a small meal at first, but then you may proceed to your regular diet.  Drink plenty of fluids but you should avoid alcoholic beverages for 24 hours.  ACTIVITY:  You should  plan to take it easy for the rest of today and you should NOT DRIVE or use heavy machinery until tomorrow (because of the sedation medicines used during the test).    FOLLOW UP: Our staff will call the number listed on your records the next business day following your procedure.  We will call around 7:15- 8:00 am to check on you and address any questions or concerns that you may have regarding the information given to you following your procedure. If we do not reach you, we will leave a message.     If any biopsies were taken you will be contacted by phone or by letter within the next 1-3 weeks.  Please call us at [phone] if you have not heard about the biopsies in 3 weeks.    SIGNATURES/CONFIDENTIALITY: You and/or your care partner have signed paperwork which will be entered into your electronic medical record.  These signatures attest to the fact that that the information above on your After Visit Summary has been reviewed and is understood.  Full responsibility of the confidentiality of this discharge information lies with you and/or your care-partner.

## 2023-05-30 ENCOUNTER — Telehealth: Payer: Self-pay | Admitting: *Deleted

## 2023-05-30 NOTE — Telephone Encounter (Signed)
Left message on f/u call 

## 2023-05-31 ENCOUNTER — Ambulatory Visit: Payer: Medicare Other

## 2023-05-31 VITALS — Wt 195.0 lb

## 2023-05-31 DIAGNOSIS — Z0000 Encounter for general adult medical examination without abnormal findings: Secondary | ICD-10-CM

## 2023-05-31 NOTE — Patient Instructions (Signed)
Juan Bennett , Thank you for taking time to come for your Medicare Wellness Visit. I appreciate your ongoing commitment to your health goals. Please review the following plan we discussed and let me know if I can assist you in the future.   Referrals/Orders/Follow-Ups/Clinician Recommendations: Aim for 30 minutes of exercise or brisk walking, 6-8 glasses of water, and 5 servings of fruits and vegetables each day.   This is a list of the screening recommended for you and due dates:  Health Maintenance  Topic Date Due   Eye exam for diabetics  03/08/2023   Pneumonia Vaccine (3 of 3 - PPSV23 or PCV20) 08/31/2023*   COVID-19 Vaccine (7 - 2023-24 season) 09/01/2023   Hemoglobin A1C  10/02/2023   Yearly kidney function blood test for diabetes  04/03/2024   Yearly kidney health urinalysis for diabetes  04/03/2024   Complete foot exam   04/03/2024   Medicare Annual Wellness Visit  05/30/2024   DTaP/Tdap/Td vaccine (3 - Td or Tdap) 09/08/2026   Colon Cancer Screening  05/28/2028   Flu Shot  Completed   Hepatitis C Screening  Completed   Zoster (Shingles) Vaccine  Completed   HPV Vaccine  Aged Out  *Topic was postponed. The date shown is not the original due date.    Advanced directives: (Copy Requested) Please bring a copy of your health care power of attorney and living will to the office to be added to your chart at your convenience.  Next Medicare Annual Wellness Visit scheduled for next year: Yes

## 2023-05-31 NOTE — Progress Notes (Addendum)
Subjective:   Juan Bennett is a 67 y.o. male who presents for an Initial Medicare Annual Wellness Visit.  Visit Complete: Virtual I connected with  Melburn Hake on 05/31/23 by a audio enabled telemedicine application and verified that I am speaking with the correct person using two identifiers.  Patient Location: Home  Provider Location: Home Office  I discussed the limitations of evaluation and management by telemedicine. The patient expressed understanding and agreed to proceed.  Vital Signs: Because this visit was a virtual/telehealth visit, some criteria may be missing or patient reported. Any vitals not documented were not able to be obtained and vitals that have been documented are patient reported.   Cardiac Risk Factors include: advanced age (>77men, >60 women);diabetes mellitus;male gender;dyslipidemia     Objective:    Today's Vitals   05/31/23 0840  Weight: 195 lb (88.5 kg)   Body mass index is 27.2 kg/m.     05/31/2023    8:43 AM  Advanced Directives  Does Patient Have a Medical Advance Directive? Yes  Type of Estate agent of [place];Living will  Copy of Healthcare Power of Attorney in Chart? No - copy requested    Current Medications (verified) Outpatient Encounter Medications as of 05/31/2023  Medication Sig   aspirin 325 MG tablet Take 325 mg by mouth daily.   empagliflozin (JARDIANCE) 10 MG TABS tablet Take 1 tablet (10 mg total) by mouth daily.   glimepiride (AMARYL) 2 MG tablet TAKE 1 TABLET BY MOUTH DAILY  WITH BREAKFAST   metFORMIN (GLUCOPHAGE) 1000 MG tablet TAKE 1 TABLET BY MOUTH TWICE  DAILY WITH A MEAL   rosuvastatin (CRESTOR) 40 MG tablet Take 1 tablet (40 mg total) by mouth daily.   No facility-administered encounter medications on file as of 05/31/2023.    Allergies (verified) Patient has no known allergies.   History: Past Medical History:  Diagnosis Date   Allergy    Astigmatism with presbyopia,  bilateral 02/11/2019   Cataracts, both eyes 02/11/2019   Diabetes mellitus    Hyperlipidemia    Past Surgical History:  Procedure Laterality Date   COLONOSCOPY  10/26/2007   w/Brodie   WISDOM TOOTH EXTRACTION     Family History  Problem Relation Age of Onset   Diabetes Mother    Dementia Mother        fall, hip fracture, she didnt improve   Deep vein thrombosis Father    Diabetes Paternal Uncle    Colon cancer Neg Hx    Esophageal cancer Neg Hx    Stomach cancer Neg Hx    Rectal cancer Neg Hx    Colon polyps Neg Hx    Social History   Socioeconomic History   Marital status: Married    Spouse name: Not on file   Number of children: Not on file   Years of education: Not on file   Highest education level: Not on file  Occupational History   Not on file  Tobacco Use   Smoking status: Former    Types: Cigarettes, Cigars   Smokeless tobacco: Never   Tobacco comments:    occasional cigars 6-8 per year. quit many years ago.   Vaping Use   Vaping status: Never Used  Substance and Sexual Activity   Alcohol use: Yes    Alcohol/week: 4.0 - 6.0 standard drinks of alcohol    Types: 4 - 6 Standard drinks or equivalent per week    Comment: weekends. 4-6 mixed drinks over  the full weekend   Drug use: No   Sexual activity: Not on file  Other Topics Concern   Not on file  Social History Narrative   Married since 25. Daughter [employer] grad as of may 2020- . Retired age 61.       Work at [employer] long time.       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    Social Determinants of Health   Financial Resource Strain: Low Risk  (05/31/2023)   Overall Financial Resource Strain (CARDIA)    Difficulty of Paying Living Expenses: Not hard at all  Food Insecurity: No Food Insecurity (05/31/2023)   Hunger Vital Sign    Worried About Running Out of Food in the Last Year: Never true    Ran Out of Food in the Last Year: Never true   Transportation Needs: No Transportation Needs (05/31/2023)   PRAPARE - Administrator, Civil Service (Medical): No    Lack of Transportation (Non-Medical): No  Physical Activity: Insufficiently Active (05/31/2023)   Exercise Vital Sign    Days of Exercise per Week: 5 days    Minutes of Exercise per Session: 20 min  Stress: No Stress Concern Present (05/31/2023)   [employer] of Occupational Health - Occupational Stress Questionnaire    Feeling of Stress : Not at all  Social Connections: Moderately Integrated (05/31/2023)   Social Connection and Isolation Panel [NHANES]    Frequency of Communication with Friends and Family: More than three times a week    Frequency of Social Gatherings with Friends and Family: More than three times a week    Attends Religious Services: 1 to 4 times per year    Active Member of [employer] or Organizations: No    Attends Banker Meetings: Never    Marital Status: Married    Tobacco Counseling Counseling given: Not Answered Tobacco comments: occasional cigars 6-8 per year. quit many years ago.    Clinical Intake:  Pre-visit preparation completed: Yes  Pain : No/denies pain     BMI - recorded: 27.2 Nutritional Status: BMI 25 -29 Overweight Nutritional Risks: None Diabetes: Yes CBG done?: No Did pt. bring in CBG monitor from home?: No  How often do you need to have someone help you when you read instructions, pamphlets, or other written materials from your doctor or pharmacy?: 1 - Never  Interpreter Needed?: No  Information entered by :: Lanier Ensign, LPN   Activities of Daily Living    05/31/2023    8:41 AM  In your present state of health, do you have any difficulty performing the following activities:  Hearing? 1  Comment HOH  Vision? 0  Difficulty concentrating or making decisions? 0  Walking or climbing stairs? 0  Dressing or bathing? 0  Doing errands, shopping? 0  Preparing Food and eating ? N   Using the Toilet? N  In the past six months, have you accidently leaked urine? N  Do you have problems with loss of bowel control? N  Managing your Medications? N  Managing your Finances? N  Housekeeping or managing your Housekeeping? N    Patient Care Team: Shelva Majestic, MD as PCP - General (Family Medicine)  Indicate any recent Medical Services you may have received from other than [facility] providers in the past year (date may be approximate).     Assessment:   This is a routine wellness examination for Lancer.  Hearing/Vision screen Hearing Screening - Comments:: Pt stated HOH  Vision Screening - Comments:: Pt follows up with [facility] for annual eye exams    Goals Addressed             This Visit's Progress    Patient Stated       Walk more        Depression Screen    05/31/2023    8:43 AM 02/24/2022    9:53 AM 02/17/2021    9:22 AM 08/24/2020   10:23 AM 02/05/2020   10:35 AM 01/28/2019    8:24 AM 07/24/2018    8:23 AM  PHQ 2/9 Scores  PHQ - 2 Score 0 0 0 0 0 0 0  PHQ- 9 Score  0  0 0      Fall Risk    05/31/2023    8:45 AM 08/30/2022    9:03 AM 02/17/2021    9:22 AM 02/05/2020   10:34 AM 08/05/2019    9:39 AM  Fall Risk   Falls in the past year? 0 0 0 1 0  Number falls in past yr: 0 0 0 0   Injury with Fall? 0 0 0 1   Comment    shoulder   Risk for fall due to : No Fall Risks No Fall Risks No Fall Risks Other (Comment)   Follow up Falls prevention discussed Falls evaluation completed Falls evaluation completed      MEDICARE RISK AT HOME: Medicare Risk at Home Any stairs in or around the home?: Yes If so, are there any without handrails?: No Home free of loose throw rugs in walkways, pet beds, electrical cords, etc?: Yes Adequate lighting in your home to reduce risk of falls?: Yes Life alert?: No Use of a cane, walker or w/c?: No Grab bars in the bathroom?: Yes Shower chair or bench in shower?: No Elevated toilet seat or a  handicapped toilet?: No  TIMED UP AND GO:  Was the test performed? No    Cognitive Function:        05/31/2023    8:45 AM  6CIT Screen  What Year? 0 points  What month? 0 points  What time? 0 points  Count back from 20 0 points  Months in reverse 0 points  Repeat phrase 0 points  Total Score 0 points    Immunizations Immunization History  Administered Date(s) Administered   Fluad Quad(high Dose 65+) 04/18/2022   Fluad Trivalent(High Dose 65+) 04/04/2023   Hep A / Hep B 07/17/2015, 08/17/2015, 01/15/2016   Influenza Whole 05/25/2009   Influenza,inj,Quad PF,6+ Mos 04/23/2015, 05/17/2017, 05/12/2018, 04/06/2021   Influenza-Unspecified 06/08/2014, 03/28/2016, 05/12/2018, 04/09/2019, 06/10/2020   PFIZER(Purple Top)SARS-COV-2 Vaccination 08/04/2019, 08/24/2019, 05/20/2020, 10/29/2020   Pfizer Covid-19 Vaccine Bivalent Booster 65yrs & up 04/18/2022   Pfizer(Comirnaty)Fall Seasonal Vaccine 12 years and older 05/01/2023   Pneumococcal Conjugate-13 09/29/2008   Pneumococcal Polysaccharide-23 09/29/2008, 02/20/2014   Td 08/25/2006   Tdap 09/08/2016   Zoster Recombinant(Shingrix) 01/17/2017, 03/20/2017    TDAP status: Up to date  Flu Vaccine status: Up to date  Pneumococcal vaccine status: Due, Education has been provided regarding the importance of this vaccine. Advised may receive this vaccine at local pharmacy or Health Dept. Aware to provide a copy of the vaccination record if obtained from local pharmacy or Health Dept. Verbalized acceptance and understanding.  Covid-19 vaccine status: Information provided on how to obtain vaccines.   Qualifies for Shingles Vaccine? Yes   Zostavax completed Yes  Shingrix Completed?: Yes  Screening Tests Health Maintenance  Topic Date Due   OPHTHALMOLOGY EXAM  03/08/2023   Pneumonia Vaccine 38+ Years old (3 of 3 - PPSV23 or PCV20) 08/31/2023 (Originally 04/12/2022)   COVID-19 Vaccine (7 - 2023-24 season) 09/01/2023   HEMOGLOBIN A1C   10/02/2023   Diabetic kidney evaluation - eGFR measurement  04/03/2024   Diabetic kidney evaluation - Urine ACR  04/03/2024   FOOT EXAM  04/03/2024   Medicare Annual Wellness (AWV)  05/30/2024   DTaP/Tdap/Td (3 - Td or Tdap) 09/08/2026   Colonoscopy  05/28/2028   INFLUENZA VACCINE  Completed   Hepatitis C Screening  Completed   Zoster Vaccines- Shingrix  Completed   HPV VACCINES  Aged Out    Health Maintenance  Health Maintenance Due  Topic Date Due   OPHTHALMOLOGY EXAM  03/08/2023    Colorectal cancer screening: Type of screening: Colonoscopy. Completed 05/29/23. Repeat every 5 years   Additional Screening:  Hepatitis C Screening Completed 04/23/15  Vision Screening: Recommended annual ophthalmology exams for early detection of glaucoma and other disorders of the eye. Is the patient up to date with their annual eye exam?  Yes  Who is the provider or what is the name of the office in which the patient attends annual eye exams? [facility]  If pt is not established with a provider, would they like to be referred to a provider to establish care? No .   Dental Screening: Recommended annual dental exams for proper oral hygiene  Diabetic Foot Exam: Diabetic Foot Exam: Completed 04/04/23  Community Resource Referral / Chronic Care Management: CRR required this visit?  No   CCM required this visit?  No    Plan:     I have personally reviewed and noted the following in the patient's chart:   Medical and social history Use of alcohol, tobacco or illicit drugs  Current medications and supplements including opioid prescriptions. Patient is not currently taking opioid prescriptions. Functional ability and status Nutritional status Physical activity Advanced directives List of other physicians Hospitalizations, surgeries, and ER visits in previous 12 months Vitals Screenings to include cognitive, depression, and falls Referrals and appointments  In addition, I  have reviewed and discussed with patient certain preventive protocols, quality metrics, and best practice recommendations. A written personalized care plan for preventive services as well as general preventive health recommendations were provided to patient.     Marzella Schlein, LPN   16/07/958   After Visit Summary: (MyChart) Due to this being a telephonic visit, the after visit summary with patients personalized plan was offered to patient via MyChart   Nurse Notes: none

## 2023-10-03 ENCOUNTER — Encounter: Payer: Self-pay | Admitting: Family Medicine

## 2023-10-03 ENCOUNTER — Ambulatory Visit: Payer: Medicare Other | Admitting: Family Medicine

## 2023-10-03 VITALS — BP 112/70 | HR 60 | Temp 97.3°F | Ht 71.0 in | Wt 189.8 lb

## 2023-10-03 DIAGNOSIS — E1169 Type 2 diabetes mellitus with other specified complication: Secondary | ICD-10-CM

## 2023-10-03 DIAGNOSIS — Z23 Encounter for immunization: Secondary | ICD-10-CM | POA: Diagnosis not present

## 2023-10-03 DIAGNOSIS — Z7984 Long term (current) use of oral hypoglycemic drugs: Secondary | ICD-10-CM

## 2023-10-03 DIAGNOSIS — Z125 Encounter for screening for malignant neoplasm of prostate: Secondary | ICD-10-CM

## 2023-10-03 DIAGNOSIS — E785 Hyperlipidemia, unspecified: Secondary | ICD-10-CM

## 2023-10-03 DIAGNOSIS — E119 Type 2 diabetes mellitus without complications: Secondary | ICD-10-CM

## 2023-10-03 LAB — COMPREHENSIVE METABOLIC PANEL
ALT: 23 U/L (ref 0–53)
AST: 19 U/L (ref 0–37)
Albumin: 4.6 g/dL (ref 3.5–5.2)
Alkaline Phosphatase: 60 U/L (ref 39–117)
BUN: 12 mg/dL (ref 6–23)
CO2: 25 meq/L (ref 19–32)
Calcium: 9.8 mg/dL (ref 8.4–10.5)
Chloride: 105 meq/L (ref 96–112)
Creatinine, Ser: 0.98 mg/dL (ref 0.40–1.50)
GFR: 80.37 mL/min (ref >60.00)
Glucose, Bld: 112 mg/dL — ABNORMAL HIGH (ref 70–99)
Potassium: 3.9 meq/L (ref 3.5–5.1)
Sodium: 139 meq/L (ref 135–145)
Total Bilirubin: 0.5 mg/dL (ref 0.2–1.2)
Total Protein: 7.1 g/dL (ref 6.0–8.3)

## 2023-10-03 LAB — MICROALBUMIN / CREATININE URINE RATIO
Creatinine,U: 97.7 mg/dL
Microalb Creat Ratio: 61.8 mg/g — ABNORMAL HIGH (ref 0.0–30.0)
Microalb, Ur: 6 mg/dL — ABNORMAL HIGH (ref 0.0–1.9)

## 2023-10-03 LAB — PSA, MEDICARE: PSA: 1.43 ng/mL (ref 0.10–4.00)

## 2023-10-03 LAB — LDL CHOLESTEROL, DIRECT: Direct LDL: 70 mg/dL

## 2023-10-03 LAB — HEMOGLOBIN A1C: Hgb A1c MFr Bld: 6.8 % — ABNORMAL HIGH (ref 4.6–6.5)

## 2023-10-03 MED ORDER — BLOOD GLUCOSE MONITORING SUPPL DEVI: 1.0000 | Freq: Three times a day (TID) | 0 refills | Status: AC

## 2023-10-03 MED ORDER — BLOOD GLUCOSE TEST VI STRP: 1.0000 | ORAL_STRIP | Freq: Three times a day (TID) | 0 refills | Status: AC

## 2023-10-03 MED ORDER — LANCETS MISC. MISC: 1.0000 | Freq: Three times a day (TID) | 0 refills | Status: AC

## 2023-10-03 MED ORDER — LANCET DEVICE MISC: 1.0000 | Freq: Three times a day (TID) | 0 refills | Status: AC

## 2023-10-03 NOTE — Patient Instructions (Addendum)
 Please stop by lab before you go If you have mychart- we will send your results within 3 business days of [place] receiving them.  If you do not have mychart- we will call you about results within 5 business days of [place] receiving them.  *please also note that you will see labs on mychart as soon as they post. I will later go in and write notes on them- will say "notes from Dr. Durene Cal"   Thanks for doing Prevnar 20 today- unless they change guidelines again should not need another pneumonia shot  Continue to pursue exercise- if walking bothering you- try stationary bike or swimming or water walking  No changes today unless labs lead [place] to make changes  Recommended follow up: Return in about 6 months (around 04/04/2024) for physical or sooner if needed.Schedule b4 you leave.

## 2023-10-03 NOTE — Progress Notes (Signed)
 Phone [phone] In person visit   Subjective:   Juan Bennett is a 68 y.o. year old very pleasant male patient who presents for/with See problem oriented charting Chief Complaint  Patient presents with   Medical Management of Chronic Issues   Diabetes    DEE requested   Hyperlipidemia   Past Medical History-  Patient Active Problem List   Diagnosis Date Noted   Well controlled diabetes mellitus (HCC) 09/04/2007    Priority: High   History of adenomatous polyp of colon 04/20/2018    Priority: Medium    Elevated LFTs 12/08/2014    Priority: Medium    Goiter 10/15/2009    Priority: Medium    Hyperlipidemia associated with type 2 diabetes mellitus (HCC) 09/04/2007    Priority: Medium    Cataracts, both eyes 02/11/2019    Priority: Low   Astigmatism with presbyopia, bilateral 02/11/2019    Priority: Low   Jock itch 07/30/2014    Priority: Low   Allergic rhinitis 09/04/2007    Priority: Low   Right shoulder pain 10/10/2019    Medications- reviewed and updated Current Outpatient Medications  Medication Sig Dispense Refill   aspirin 325 MG tablet Take 325 mg by mouth daily.     Blood Glucose Monitoring Suppl DEVI 1 each by Does not apply route in the morning, at noon, and at bedtime. May substitute to any manufacturer covered by patient's insurance. 1 each 0   empagliflozin (JARDIANCE) 10 MG TABS tablet Take 1 tablet (10 mg total) by mouth daily. 90 tablet 3   glimepiride (AMARYL) 2 MG tablet TAKE 1 TABLET BY MOUTH DAILY  WITH BREAKFAST 120 tablet 2   Glucose Blood (BLOOD GLUCOSE TEST STRIPS) STRP 1 each by In Vitro route in the morning, at noon, and at bedtime. May substitute to any manufacturer covered by patient's insurance. 100 strip 0   Lancet Device MISC 1 each by Does not apply route in the morning, at noon, and at bedtime. May substitute to any manufacturer covered by patient's insurance. 1 each 0   Lancets Misc. MISC 1 each by Does not apply route in the  morning, at noon, and at bedtime. May substitute to any manufacturer covered by patient's insurance. 100 each 0   metFORMIN (GLUCOPHAGE) 1000 MG tablet TAKE 1 TABLET BY MOUTH TWICE  DAILY WITH A MEAL 240 tablet 2   rosuvastatin (CRESTOR) 40 MG tablet Take 1 tablet (40 mg total) by mouth daily. 90 tablet 3   No current facility-administered medications for this visit.     Objective:  BP 112/70   Pulse 60   Temp (!) 97.3 F (36.3 C)   Ht 5\' 11"  (1.803 m)   Wt 189 lb 12.8 oz (86.1 kg)   SpO2 98%   BMI 26.47 kg/m  Gen: NAD, resting comfortably CV: RRR no murmurs rubs or gallops Lungs: CTAB no crackles, wheeze, rhonchi Ext: no edema Skin: warm, dry     Assessment and Plan   #PSA up slightly September 2024- trend PSA today Lab Results  Component Value Date   PSA 1.29 04/04/2023   PSA 0.73 02/24/2022   PSA 0.72 02/17/2021   # Diabetes-A1c typically under 7 S: Medication:Jardiance 10 mg daily, metformin 1000 mg twice daily, and Glimepride 2 mg daily CBGs- doesn't check but no feelings of lows.  Exercise and diet- Down 6 pounds from last visit. Walking has been down some- knees and back bothering him. No increased urination Lab Results  Component Value  Date   HGBA1C 7.0 (H) 04/04/2023   HGBA1C 7.0 (H) 09/07/2022   HGBA1C 7.1 (H) 02/24/2022  A/P: Right at goal last check-hopefully stable today-update A1c -We discussed lab error with micro and creatinine ratio but appears he is still within normal range-update today  -team requested eye exam -he is honest not feeling motivated on exercise  #hyperlipidemia/elevated LFTs S: Medication: currently on rosuvastatin 40 mg daily up from  atorvastatin 40 mg daily - no issues reported -LFTs elevated in the past but recently had been well controlled- check today Lab Results  Component Value Date   CHOL 190 04/04/2023   HDL 64.20 04/04/2023   LDLCALC 106 (H) 04/04/2023   LDLDIRECT 152.0 08/30/2022   TRIG 97.0 04/04/2023   CHOLHDL 3  04/04/2023  A/P: hoping LDL under 100 at least with weight loss and change to rosuvastatin  Recommended follow up: Return in about 6 months (around 04/04/2024) for physical or sooner if needed.Schedule b4 you leave.   Lab/Order associations:   ICD-10-CM   1. Well controlled diabetes mellitus (HCC)  E11.9 Hemoglobin A1c    Urine Microalbumin w/creat. ratio    Comprehensive metabolic panel    2. Hyperlipidemia associated with type 2 diabetes mellitus (HCC)  E11.69 Direct LDL   E78.5     3. Screening for prostate cancer  Z12.5 PSA, Medicare    4. Need for pneumococcal 20-valent conjugate vaccination  Z23 Pneumococcal conjugate vaccine 20-valent (Prevnar 20)      Meds ordered this encounter  Medications   Blood Glucose Monitoring Suppl DEVI    Sig: 1 each by Does not apply route in the morning, at noon, and at bedtime. May substitute to any manufacturer covered by patient's insurance.    Dispense:  1 each    Refill:  0   Glucose Blood (BLOOD GLUCOSE TEST STRIPS) STRP    Sig: 1 each by In Vitro route in the morning, at noon, and at bedtime. May substitute to any manufacturer covered by patient's insurance.    Dispense:  100 strip    Refill:  0   Lancet Device MISC    Sig: 1 each by Does not apply route in the morning, at noon, and at bedtime. May substitute to any manufacturer covered by patient's insurance.    Dispense:  1 each    Refill:  0   Lancets Misc. MISC    Sig: 1 each by Does not apply route in the morning, at noon, and at bedtime. May substitute to any manufacturer covered by patient's insurance.    Dispense:  100 each    Refill:  0    Return precautions advised.  Tana Conch, MD

## 2023-10-04 ENCOUNTER — Other Ambulatory Visit: Payer: Self-pay

## 2023-10-04 MED ORDER — VALSARTAN 40 MG PO TABS: 40.0000 mg | ORAL_TABLET | Freq: Every day | ORAL | 3 refills | Status: DC

## 2023-11-22 ENCOUNTER — Encounter: Payer: Self-pay | Admitting: Family Medicine

## 2023-11-22 ENCOUNTER — Ambulatory Visit: Admitting: Family Medicine

## 2023-11-22 VITALS — BP 122/60 | HR 58 | Temp 97.4°F | Ht 71.0 in | Wt 192.6 lb

## 2023-11-22 DIAGNOSIS — E1169 Type 2 diabetes mellitus with other specified complication: Secondary | ICD-10-CM | POA: Diagnosis not present

## 2023-11-22 DIAGNOSIS — E785 Hyperlipidemia, unspecified: Secondary | ICD-10-CM

## 2023-11-22 DIAGNOSIS — Z7984 Long term (current) use of oral hypoglycemic drugs: Secondary | ICD-10-CM

## 2023-11-22 DIAGNOSIS — Z125 Encounter for screening for malignant neoplasm of prostate: Secondary | ICD-10-CM

## 2023-11-22 DIAGNOSIS — E119 Type 2 diabetes mellitus without complications: Secondary | ICD-10-CM

## 2023-11-22 DIAGNOSIS — E049 Nontoxic goiter, unspecified: Secondary | ICD-10-CM

## 2023-11-22 LAB — MICROALBUMIN / CREATININE URINE RATIO
Creatinine,U: 52.4 mg/dL
Microalb Creat Ratio: 80.3 mg/g — ABNORMAL HIGH (ref 0.0–30.0)
Microalb, Ur: 4.2 mg/dL — ABNORMAL HIGH (ref 0.0–1.9)

## 2023-11-22 LAB — CBC WITH DIFFERENTIAL/PLATELET
Basophils Absolute: 0.1 10*3/uL (ref 0.0–0.1)
Basophils Relative: 1.2 % (ref 0.0–3.0)
Eosinophils Absolute: 0.1 10*3/uL (ref 0.0–0.7)
Eosinophils Relative: 2.1 % (ref 0.0–5.0)
HCT: 42 % (ref 39.0–52.0)
Hemoglobin: 13.9 g/dL (ref 13.0–17.0)
Lymphocytes Relative: 25.6 % (ref 12.0–46.0)
Lymphs Abs: 1.3 10*3/uL (ref 0.7–4.0)
MCHC: 33.1 g/dL (ref 30.0–36.0)
MCV: 90.8 fl (ref 78.0–100.0)
Monocytes Absolute: 0.5 10*3/uL (ref 0.1–1.0)
Monocytes Relative: 11 % (ref 3.0–12.0)
Neutro Abs: 3 10*3/uL (ref 1.4–7.7)
Neutrophils Relative %: 60.1 % (ref 43.0–77.0)
Platelets: 174 10*3/uL (ref 150.0–400.0)
RBC: 4.62 Mil/uL (ref 4.22–5.81)
RDW: 13.8 % (ref 11.5–15.5)
WBC: 5 10*3/uL (ref 4.0–10.5)

## 2023-11-22 LAB — COMPREHENSIVE METABOLIC PANEL WITH GFR
ALT: 21 U/L (ref 0–53)
AST: 17 U/L (ref 0–37)
Albumin: 4.3 g/dL (ref 3.5–5.2)
Alkaline Phosphatase: 54 U/L (ref 39–117)
BUN: 16 mg/dL (ref 6–23)
CO2: 25 meq/L (ref 19–32)
Calcium: 9.2 mg/dL (ref 8.4–10.5)
Chloride: 103 meq/L (ref 96–112)
Creatinine, Ser: 0.98 mg/dL (ref 0.40–1.50)
GFR: 80.3 mL/min (ref >60.00)
Glucose, Bld: 114 mg/dL — ABNORMAL HIGH (ref 70–99)
Potassium: 3.9 meq/L (ref 3.5–5.1)
Sodium: 134 meq/L — ABNORMAL LOW (ref 135–145)
Total Bilirubin: 0.4 mg/dL (ref 0.2–1.2)
Total Protein: 6.5 g/dL (ref 6.0–8.3)

## 2023-11-22 LAB — TSH: TSH: 2.11 u[IU]/mL (ref 0.35–5.50)

## 2023-11-22 LAB — PSA, MEDICARE: PSA: 0.99 ng/mL (ref 0.10–4.00)

## 2023-11-22 NOTE — Progress Notes (Signed)
 Phone [phone] In person visit   Subjective:   Juan Bennett is a 68 y.o. year old very pleasant male patient who presents for/with See problem oriented charting Chief Complaint  Patient presents with   Medical Management of Chronic Issues   Diabetes   Hyperlipidemia   Past Medical History-  Patient Active Problem List   Diagnosis Date Noted   Well controlled diabetes mellitus (HCC) 09/04/2007    Priority: High   History of adenomatous polyp of colon 04/20/2018    Priority: Medium    Elevated LFTs 12/08/2014    Priority: Medium    Goiter 10/15/2009    Priority: Medium    Hyperlipidemia associated with type 2 diabetes mellitus (HCC) 09/04/2007    Priority: Medium    Cataracts, both eyes 02/11/2019    Priority: Low   Astigmatism with presbyopia, bilateral 02/11/2019    Priority: Low   Jock itch 07/30/2014    Priority: Low   Allergic rhinitis 09/04/2007    Priority: Low   Right shoulder pain 10/10/2019    Medications- reviewed and updated Current Outpatient Medications  Medication Sig Dispense Refill   aspirin 325 MG tablet Take 325 mg by mouth daily.     Blood Glucose Monitoring Suppl DEVI 1 each by Does not apply route in the morning, at noon, and at bedtime. May substitute to any manufacturer covered by patient's insurance. 1 each 0   empagliflozin  (JARDIANCE ) 10 MG TABS tablet Take 1 tablet (10 mg total) by mouth daily. 90 tablet 3   glimepiride  (AMARYL ) 2 MG tablet TAKE 1 TABLET BY MOUTH DAILY  WITH BREAKFAST 120 tablet 2   metFORMIN  (GLUCOPHAGE ) 1000 MG tablet TAKE 1 TABLET BY MOUTH TWICE  DAILY WITH A MEAL 240 tablet 2   rosuvastatin  (CRESTOR ) 40 MG tablet Take 1 tablet (40 mg total) by mouth daily. 90 tablet 3   valsartan  (DIOVAN ) 40 MG tablet Take 1 tablet (40 mg total) by mouth daily. 90 tablet 3   No current facility-administered medications for this visit.     Objective:  BP 122/60   Pulse (!) 58   Temp (!) 97.4 F (36.3 C)   Ht 5\' 11"  (1.803  m)   Wt 192 lb 9.6 oz (87.4 kg)   SpO2 98%   BMI 26.86 kg/m  Gen: NAD, resting comfortably CV: RRR no murmurs rubs or gallops Lungs: CTAB no crackles, wheeze, rhonchi Ext: no edema Skin: warm, dry     Assessment and Plan   # PSA elevation-PSA up more than I would like over the last year but comparing back to about 5 years ago not drastically increased from 0.83-1.43 over 5 years-we opted to get another data point today Lab Results  Component Value Date   PSA 1.43 10/03/2023   PSA 1.29 04/04/2023   PSA 0.73 02/24/2022   # Diabetes-A1c typically under 7 with microalbuminuria S: Medication:Jardiance  10 mg daily, metformin  1000 mg twice daily, and Glimepride 2 mg daily -On valsartan  40 mg now with elevated microalbumin creatinine ratio of 61 March 2025 . Some mild fatigue/feeling more run down since starting this Lab Results  Component Value Date   HGBA1C 6.8 (H) 10/03/2023   HGBA1C 7.0 (H) 04/04/2023   HGBA1C 7.0 (H) 09/07/2022  A/P: A1c is well-controlled.  Microalbumin to creatinine ratio is elevated-hopefully stable-update today and also update CMP as we have recently started valsartan   #hyperlipidemia/elevated LFTs S: Medication: currently on  rosuvastatin  40 mg daily- no issues reported -LFTs elevated in  the past but recently had been well controlled Lab Results  Component Value Date   CHOL 190 04/04/2023   HDL 64.20 04/04/2023   LDLCALC 106 (H) 04/04/2023   LDLDIRECT 70.0 10/03/2023   TRIG 97.0 04/04/2023   CHOLHDL 3 04/04/2023   A/P: Right at goal last check-continue current medication -Fortunately liver function has been stable lately  Recommended follow up: Return for next already scheduled visit or sooner if needed. Future Appointments  Date Time Provider Department Center  04/16/2024  9:20 AM Almira Jaeger, MD [facility]   Lab/Order associations:   ICD-10-CM   1. Well controlled diabetes mellitus (HCC)  E11.9 Comprehensive metabolic panel with GFR     Microalbumin / creatinine urine ratio    CBC with Differential/Platelet    2. Screening for prostate cancer  Z12.5 PSA, Medicare    3. Goiter  E04.9 TSH    4. Hyperlipidemia associated with type 2 diabetes mellitus (HCC)  E11.69    E78.5       No orders of the defined types were placed in this encounter.   Return precautions advised.  Clarisa Crooked, MD

## 2023-11-22 NOTE — Patient Instructions (Addendum)
Please stop by lab before you go If you have mychart- we will send your results within 3 business days of us receiving them.  If you do not have mychart- we will call you about results within 5 business days of us receiving them.  *please also note that you will see labs on mychart as soon as they post. I will later go in and write notes on them- will say "notes from Dr. Barnard Sharps"   Recommended follow up: Return for next already scheduled visit or sooner if needed. 

## 2023-11-23 ENCOUNTER — Encounter: Payer: Self-pay | Admitting: Family Medicine

## 2023-11-24 ENCOUNTER — Other Ambulatory Visit: Payer: Self-pay

## 2023-11-24 MED ORDER — VALSARTAN 80 MG PO TABS: 80.0000 mg | ORAL_TABLET | Freq: Every day | ORAL | 3 refills | Status: DC

## 2023-11-27 ENCOUNTER — Other Ambulatory Visit: Payer: Self-pay

## 2023-11-27 MED ORDER — VALSARTAN 80 MG PO TABS: 80.0000 mg | ORAL_TABLET | Freq: Every day | ORAL | 3 refills | Status: AC

## 2023-11-27 MED ORDER — ROSUVASTATIN CALCIUM 40 MG PO TABS: 40.0000 mg | ORAL_TABLET | Freq: Every day | ORAL | 3 refills | Status: AC

## 2023-12-11 ENCOUNTER — Other Ambulatory Visit: Payer: Self-pay | Admitting: Family Medicine

## 2024-02-04 ENCOUNTER — Other Ambulatory Visit: Payer: Self-pay | Admitting: Family Medicine

## 2024-04-16 ENCOUNTER — Encounter: Payer: Self-pay | Admitting: Family Medicine

## 2024-04-16 ENCOUNTER — Ambulatory Visit: Payer: Self-pay | Admitting: Family Medicine

## 2024-04-16 ENCOUNTER — Ambulatory Visit (INDEPENDENT_AMBULATORY_CARE_PROVIDER_SITE_OTHER): Admitting: Family Medicine

## 2024-04-16 VITALS — BP 110/60 | HR 63 | Temp 98.1°F | Ht 71.0 in | Wt 190.0 lb

## 2024-04-16 DIAGNOSIS — E119 Type 2 diabetes mellitus without complications: Secondary | ICD-10-CM

## 2024-04-16 DIAGNOSIS — Z125 Encounter for screening for malignant neoplasm of prostate: Secondary | ICD-10-CM | POA: Diagnosis not present

## 2024-04-16 DIAGNOSIS — Z0000 Encounter for general adult medical examination without abnormal findings: Secondary | ICD-10-CM | POA: Diagnosis not present

## 2024-04-16 DIAGNOSIS — E1169 Type 2 diabetes mellitus with other specified complication: Secondary | ICD-10-CM

## 2024-04-16 DIAGNOSIS — E785 Hyperlipidemia, unspecified: Secondary | ICD-10-CM | POA: Diagnosis not present

## 2024-04-16 DIAGNOSIS — Z7984 Long term (current) use of oral hypoglycemic drugs: Secondary | ICD-10-CM

## 2024-04-16 LAB — COMPREHENSIVE METABOLIC PANEL WITH GFR
ALT: 21 U/L (ref 0–53)
AST: 18 U/L (ref 0–37)
Albumin: 4.7 g/dL (ref 3.5–5.2)
Alkaline Phosphatase: 62 U/L (ref 39–117)
BUN: 18 mg/dL (ref 6–23)
CO2: 25 meq/L (ref 19–32)
Calcium: 9.8 mg/dL (ref 8.4–10.5)
Chloride: 105 meq/L (ref 96–112)
Creatinine, Ser: 1.19 mg/dL (ref 0.40–1.50)
GFR: 63.43 mL/min (ref >60.00)
Glucose, Bld: 121 mg/dL — ABNORMAL HIGH (ref 70–99)
Potassium: 4.3 meq/L (ref 3.5–5.1)
Sodium: 137 meq/L (ref 135–145)
Total Bilirubin: 0.5 mg/dL (ref 0.2–1.2)
Total Protein: 7.3 g/dL (ref 6.0–8.3)

## 2024-04-16 LAB — CBC WITH DIFFERENTIAL/PLATELET
Basophils Absolute: 0.1 K/uL (ref 0.0–0.1)
Basophils Relative: 1.5 % (ref 0.0–3.0)
Eosinophils Absolute: 0.1 K/uL (ref 0.0–0.7)
Eosinophils Relative: 2.3 % (ref 0.0–5.0)
HCT: 43.7 % (ref 39.0–52.0)
Hemoglobin: 14.5 g/dL (ref 13.0–17.0)
Lymphocytes Relative: 24.6 % (ref 12.0–46.0)
Lymphs Abs: 1 K/uL (ref 0.7–4.0)
MCHC: 33.1 g/dL (ref 30.0–36.0)
MCV: 89.1 fl (ref 78.0–100.0)
Monocytes Absolute: 0.4 K/uL (ref 0.1–1.0)
Monocytes Relative: 10.2 % (ref 3.0–12.0)
Neutro Abs: 2.6 K/uL (ref 1.4–7.7)
Neutrophils Relative %: 61.4 % (ref 43.0–77.0)
Platelets: 197 K/uL (ref 150.0–400.0)
RBC: 4.9 Mil/uL (ref 4.22–5.81)
RDW: 13.7 % (ref 11.5–15.5)
WBC: 4.2 K/uL (ref 4.0–10.5)

## 2024-04-16 LAB — POCT GLYCOSYLATED HEMOGLOBIN (HGB A1C): Hemoglobin A1C: 6.1 % — AB (ref 4.0–5.6)

## 2024-04-16 LAB — LIPID PANEL
Cholesterol: 170 mg/dL (ref 0–200)
HDL: 68.5 mg/dL (ref >39.00)
LDL Cholesterol: 85 mg/dL (ref 0–99)
NonHDL: 101.72
Total CHOL/HDL Ratio: 2
Triglycerides: 84 mg/dL (ref 0.0–149.0)
VLDL: 16.8 mg/dL (ref 0.0–40.0)

## 2024-04-16 LAB — MICROALBUMIN / CREATININE URINE RATIO
Creatinine,U: 66.5 mg/dL
Microalb Creat Ratio: 39.6 mg/g — ABNORMAL HIGH (ref 0.0–30.0)
Microalb, Ur: 2.6 mg/dL — ABNORMAL HIGH (ref 0.0–1.9)

## 2024-04-16 LAB — PSA, MEDICARE: PSA: 1.49 ng/mL (ref 0.10–4.00)

## 2024-04-16 NOTE — Patient Instructions (Addendum)
 Let me know when you get COVID and flu shot  Any hint of glimepiride  causing low sugar such as under 80 lets stop the medicine and let me know  Please stop by lab before you go If you have mychart- we will send your results within 3 business days of us  receiving them.  If you do not have mychart- we will call you about results within 5 business days of us  receiving them.  *please also note that you will see labs on mychart as soon as they post. I will later go in and write notes on them- will say notes from Dr. Katrinka   Recommended follow up: Return in about 6 months (around 10/14/2024) for followup or sooner if needed.Schedule b4 you leave.

## 2024-04-16 NOTE — Progress Notes (Signed)
 Phone: [phone]   Subjective:  Patient presents today for their annual physical. Chief complaint-noted.   See problem oriented charting- ROS- full  review of systems was completed and negative  except for topics noted under acute/chronic concerns  The following were reviewed and entered/updated in epic: Past Medical History:  Diagnosis Date   Allergy    Astigmatism with presbyopia, bilateral 02/11/2019   Cataracts, both eyes 02/11/2019   Diabetes mellitus    Hyperlipidemia    Patient Active Problem List   Diagnosis Date Noted   Well controlled diabetes mellitus (HCC) 09/04/2007    Priority: High   History of adenomatous polyp of colon 04/20/2018    Priority: Medium    Elevated LFTs 12/08/2014    Priority: Medium    Goiter 10/15/2009    Priority: Medium    Hyperlipidemia associated with type 2 diabetes mellitus (HCC) 09/04/2007    Priority: Medium    Cataracts, both eyes 02/11/2019    Priority: Low   Astigmatism with presbyopia, bilateral 02/11/2019    Priority: Low   Jock itch 07/30/2014    Priority: Low   Allergic rhinitis 09/04/2007    Priority: Low   Right shoulder pain 10/10/2019   Past Surgical History:  Procedure Laterality Date   COLONOSCOPY  10/26/2007   w/Brodie   WISDOM TOOTH EXTRACTION      Family History  Problem Relation Age of Onset   Diabetes Mother    Dementia Mother        fall, hip fracture, she didnt improve   Deep vein thrombosis Father    Diabetes Paternal Uncle    Colon cancer Neg Hx    Esophageal cancer Neg Hx    Stomach cancer Neg Hx    Rectal cancer Neg Hx    Colon polyps Neg Hx     Medications- reviewed and updated Current Outpatient Medications  Medication Sig Dispense Refill   aspirin 325 MG tablet Take 325 mg by mouth daily.     Blood Glucose Monitoring Suppl DEVI 1 each by Does not apply route in the morning, at noon, and at bedtime. May substitute to any manufacturer covered by patient's insurance. 1 each 0    glimepiride  (AMARYL ) 2 MG tablet TAKE 1 TABLET BY MOUTH DAILY  WITH BREAKFAST 120 tablet 2   JARDIANCE  10 MG TABS tablet TAKE 1 TABLET(10 MG) BY MOUTH DAILY 90 tablet 3   metFORMIN  (GLUCOPHAGE ) 1000 MG tablet TAKE 1 TABLET BY MOUTH TWICE  DAILY WITH MEALS 240 tablet 2   rosuvastatin  (CRESTOR ) 40 MG tablet Take 1 tablet (40 mg total) by mouth daily. 90 tablet 3   valsartan  (DIOVAN ) 80 MG tablet Take 1 tablet (80 mg total) by mouth daily. 90 tablet 3   No current facility-administered medications for this visit.    Allergies-reviewed and updated No Known Allergies  Social History   Social History Narrative   Married since 60. Daughter [employer] grad as of may 2020- . Retired age 81.       Work at [employer] long time.       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 Oropharynx normal Neck: no  thyromegaly CV: RRR no murmurs rubs or gallops Lungs: CTAB no crackles, wheeze, rhonchi Abdomen: soft/nontender/nondistended/normal bowel sounds. No rebound or guarding.  Ext: no edema Skin: warm, dry Neuro: grossly normal, moves all extremities, PERRLA   Diabetic foot exam was performed with the following findings:   No deformities, ulcerations, or other skin breakdown Normal sensation of 10g monofilament Intact posterior tibialis and dorsalis pedis pulses       Assessment and Plan  68 y.o. male presenting for annual physical.  Health Maintenance counseling: 1. Anticipatory guidance: Patient counseled regarding regular dental exams -q6 months, eye exams -yearly,  avoiding smoking and second hand smoke, limiting alcohol to 2 beverages per day - 4-5 per week, no illicit drugs .   2. Risk factor reduction:  Advised patient of  need for regular exercise and diet rich and fruits and vegetables to reduce risk of heart attack and stroke.  Exercise- some walking- encouraged 150 minutes a week. Does not anticipate changing despite education on benefits.  Diet/weight management-Down 2 pounds from last physical.  Wt Readings from Last 3 Encounters:  04/16/24 190 lb (86.2 kg)  11/22/23 192 lb 9.6 oz (87.4 kg)  10/03/23 189 lb 12.8 oz (86.1 kg)  3. Immunizations/screenings/ancillary studies-flu shot plan at pharmacy later in the year- scheduled for this along with, COVID vaccination  Immunization History  Administered Date(s) Administered    sv, Bivalent, Protein Subunit Rsvpref,pf [place]) 05/16/2022   Fluad Quad(high Dose 65+) 04/18/2022   Fluad Trivalent(High Dose 65+) 04/04/2023   Hep A / Hep B 07/17/2015, 08/17/2015, 01/15/2016   Influenza Whole 05/25/2009   Influenza,inj,Quad PF,6+ Mos 04/23/2015, 05/17/2017, 05/12/2018, 04/06/2021   Influenza-Unspecified 06/08/2014, 03/28/2016, 05/12/2018, 04/09/2019, 06/10/2020   PFIZER(Purple Top)SARS-COV-2 Vaccination 08/04/2019, 08/24/2019, 05/20/2020, 10/29/2020   PNEUMOCOCCAL CONJUGATE-20 10/03/2023   Pfizer Covid-19 Vaccine Bivalent Booster 35yrs & up 04/26/2021, 04/18/2022   Pfizer(Comirnaty )Fall Seasonal Vaccine 12 years and older 05/01/2023   Pneumococcal Conjugate-13 09/29/2008   Pneumococcal Polysaccharide-23 09/29/2008, 02/20/2014   Td 08/25/2006   Tdap 09/08/2016   Zoster Recombinant(Shingrix ) 01/17/2017, 03/20/2017  4. Prostate cancer screening-  Low risk prior trend- update psa today  Lab Results  Component Value Date   PSA 0.99 11/22/2023   PSA 1.43 10/03/2023   PSA 1.29 04/04/2023   5. Colon cancer screening - 05/29/23 with 10 year repeat 6. Skin cancer screening- dermatology as needed- no recent issues. advised regular sunscreen use. Denies worrisome, changing, or new skin lesions.  7. Smoking associated screening (lung cancer screening, AAA screen 65-75,  UA)-never smoker - under 100 cigars in lifetime many years ago 8. STD screening - only active with wife  Status of chronic or acute concerns   # Diabetes-A1c typically under 7 with microalbuminuria S: Medication:Jardiance  10 mg daily, metformin  1000 mg twice daily, and Glimepride 2 mg daily -On valsartan  40 mg now with elevated microalbumin creatinine ratio of 61 March 2025- later up to 80 mg- may be able to go to 160 mg possibly if blood pressure tolerates CBGs-not checking Lab Results  Component Value Date   HGBA1C 6.1 (A) 04/16/2024   HGBA1C 6.8 (H) 10/03/2023   HGBA1C 7.0 (H) 04/04/2023  A/P: well controlled continue current medications - may drop glimepiride  in future  UACR elevated but hopefully improving on increased valsartan   #hyperlipidemia/elevated LFTs S: Medication: currently on  rosuvastatin  40 mg daily- no issues reported -LFTs elevated in the past but recently had been well controlled -Mild poor control on this Lab Results  Component Value Date  CHOL 190 04/04/2023   HDL 64.20 04/04/2023   LDLCALC 106 (H) 04/04/2023   LDLDIRECT 70.0 10/03/2023   TRIG 97.0 04/04/2023   CHOLHDL 3 04/04/2023   Lab Results  Component Value Date   ALT 21 11/22/2023   AST 17 11/22/2023   ALKPHOS 54 11/22/2023   BILITOT 0.4 11/22/2023  A/P: cholesterol on last check in march was right at goal 70- update full panel  #Goiter--continue to trend TSH at least annually- fine in April check at least annually  # hearing loss- corrected in 2025   Recommended follow up: Return in about 6 months (around 10/14/2024) for followup or sooner if needed.Schedule b4 you leave.  Lab/Order associations: fasting   ICD-10-CM   1. Preventative health care  Z00.00     2. Well controlled diabetes mellitus (HCC)  E11.9 POCT glycosylated hemoglobin (Hb A1C)    Comprehensive metabolic panel with GFR    CBC with Differential/Platelet    Lipid panel    Microalbumin / creatinine urine ratio    3.  Screening for prostate cancer  Z12.5 PSA, Medicare    4. Hyperlipidemia associated with type 2 diabetes mellitus (HCC)  E11.69    E78.5       No orders of the defined types were placed in this encounter.   Return precautions advised.  Garnette Lukes, MD

## 2024-04-23 LAB — OPHTHALMOLOGY REPORT-SCANNED: A Comment: UNDETERMINED

## 2024-10-15 ENCOUNTER — Ambulatory Visit: Admitting: Family Medicine
